# Patient Record
Sex: Female | Born: 1955 | Race: White | Hispanic: No | Marital: Married | Smoking: Never smoker
Health system: Southern US, Community
[De-identification: ages and names within clinical notes are randomized; demographics above are authoritative.]

## PROBLEM LIST (undated history)

## (undated) ENCOUNTER — Ambulatory Visit (HOSPITAL_BASED_OUTPATIENT_CLINIC_OR_DEPARTMENT_OTHER)

## (undated) DIAGNOSIS — F419 Anxiety disorder, unspecified: Secondary | ICD-10-CM

## (undated) DIAGNOSIS — K602 Anal fissure, unspecified: Secondary | ICD-10-CM

## (undated) DIAGNOSIS — L219 Seborrheic dermatitis, unspecified: Secondary | ICD-10-CM

## (undated) DIAGNOSIS — E669 Obesity, unspecified: Secondary | ICD-10-CM

## (undated) DIAGNOSIS — F32A Depression, unspecified: Secondary | ICD-10-CM

## (undated) DIAGNOSIS — M549 Dorsalgia, unspecified: Secondary | ICD-10-CM

## (undated) DIAGNOSIS — K589 Irritable bowel syndrome without diarrhea: Secondary | ICD-10-CM

## (undated) DIAGNOSIS — K5900 Constipation, unspecified: Secondary | ICD-10-CM

## (undated) DIAGNOSIS — K5790 Diverticulosis of intestine, part unspecified, without perforation or abscess without bleeding: Secondary | ICD-10-CM

## (undated) DIAGNOSIS — E785 Hyperlipidemia, unspecified: Secondary | ICD-10-CM

## (undated) DIAGNOSIS — C9110 Chronic lymphocytic leukemia of B-cell type not having achieved remission: Secondary | ICD-10-CM

## (undated) DIAGNOSIS — N200 Calculus of kidney: Secondary | ICD-10-CM

## (undated) HISTORY — DX: Diverticulosis of intestine, part unspecified, without perforation or abscess without bleeding: K57.90

## (undated) HISTORY — DX: Chronic lymphocytic leukemia of B-cell type not having achieved remission: C91.10

## (undated) HISTORY — DX: Anxiety disorder, unspecified: F41.9

## (undated) HISTORY — DX: Constipation, unspecified: K59.00

## (undated) HISTORY — DX: Depression, unspecified: F32.A

## (undated) HISTORY — DX: Hyperlipidemia, unspecified: E78.5

## (undated) HISTORY — DX: Dorsalgia, unspecified: M54.9

## (undated) HISTORY — PX: OTHER SURGICAL HISTORY: SHX169

## (undated) HISTORY — DX: Calculus of kidney: N20.0

## (undated) HISTORY — DX: Obesity, unspecified: E66.9

## (undated) HISTORY — PX: LITHOTRIPSY: SUR834

## (undated) HISTORY — DX: Anal fissure, unspecified: K60.2

## (undated) HISTORY — PX: ANAL FISSURE REPAIR: SHX2312

## (undated) HISTORY — DX: Irritable bowel syndrome, unspecified: K58.9

---

## 1998-05-19 ENCOUNTER — Ambulatory Visit (HOSPITAL_COMMUNITY): Admission: RE | Admit: 1998-05-19 | Discharge: 1998-05-19 | Payer: Self-pay | Admitting: General Surgery

## 1998-10-13 DIAGNOSIS — K649 Unspecified hemorrhoids: Secondary | ICD-10-CM | POA: Insufficient documentation

## 2003-11-29 ENCOUNTER — Ambulatory Visit: Payer: Self-pay | Admitting: Internal Medicine

## 2003-11-30 ENCOUNTER — Ambulatory Visit: Payer: Self-pay | Admitting: Internal Medicine

## 2003-12-01 ENCOUNTER — Encounter: Payer: Self-pay | Admitting: Internal Medicine

## 2004-01-06 ENCOUNTER — Ambulatory Visit: Payer: Self-pay | Admitting: Internal Medicine

## 2006-11-25 ENCOUNTER — Ambulatory Visit: Payer: Self-pay | Admitting: Internal Medicine

## 2007-02-20 DIAGNOSIS — K589 Irritable bowel syndrome without diarrhea: Secondary | ICD-10-CM

## 2007-02-20 HISTORY — DX: Irritable bowel syndrome, unspecified: K58.9

## 2007-03-09 ENCOUNTER — Ambulatory Visit: Payer: Self-pay | Admitting: Oncology

## 2007-03-27 ENCOUNTER — Ambulatory Visit: Payer: Self-pay | Admitting: Hematology & Oncology

## 2007-03-31 ENCOUNTER — Ambulatory Visit: Payer: Self-pay | Admitting: Oncology

## 2007-04-09 ENCOUNTER — Ambulatory Visit: Payer: Self-pay | Admitting: Oncology

## 2007-05-09 ENCOUNTER — Ambulatory Visit: Payer: Self-pay | Admitting: Oncology

## 2007-06-09 DIAGNOSIS — K602 Anal fissure, unspecified: Secondary | ICD-10-CM | POA: Insufficient documentation

## 2007-06-09 DIAGNOSIS — Z87442 Personal history of urinary calculi: Secondary | ICD-10-CM | POA: Insufficient documentation

## 2007-06-09 HISTORY — DX: Personal history of urinary calculi: Z87.442

## 2007-06-10 ENCOUNTER — Ambulatory Visit: Payer: Self-pay | Admitting: Internal Medicine

## 2007-07-17 ENCOUNTER — Ambulatory Visit: Payer: Self-pay | Admitting: Hematology & Oncology

## 2007-07-17 ENCOUNTER — Other Ambulatory Visit: Admission: RE | Admit: 2007-07-17 | Discharge: 2007-07-17 | Payer: Self-pay | Admitting: Hematology & Oncology

## 2007-07-17 ENCOUNTER — Encounter: Payer: Self-pay | Admitting: Hematology & Oncology

## 2007-07-21 LAB — COMPREHENSIVE METABOLIC PANEL
ALT: 27 U/L (ref 0–35)
Albumin: 5.1 g/dL (ref 3.5–5.2)
CO2: 25 mEq/L (ref 19–32)
Glucose, Bld: 85 mg/dL (ref 70–99)
Potassium: 4.1 mEq/L (ref 3.5–5.3)
Sodium: 138 mEq/L (ref 135–145)
Total Bilirubin: 0.4 mg/dL (ref 0.3–1.2)
Total Protein: 7.7 g/dL (ref 6.0–8.3)

## 2007-07-21 LAB — PROTEIN ELECTROPHORESIS, SERUM
Alpha-2-Globulin: 9.8 % (ref 7.1–11.8)
Beta Globulin: 6.4 % (ref 4.7–7.2)
Gamma Globulin: 10.5 % — ABNORMAL LOW (ref 11.1–18.8)

## 2007-07-21 LAB — FERRITIN: Ferritin: 52 ng/mL (ref 10–291)

## 2007-07-21 LAB — RETICULOCYTES (CHCC): ABS Retic: 57.7 10*3/uL (ref 19.0–186.0)

## 2007-09-23 ENCOUNTER — Ambulatory Visit: Payer: Self-pay | Admitting: Hematology & Oncology

## 2007-09-24 LAB — CBC WITH DIFFERENTIAL (CANCER CENTER ONLY)
HCT: 36.4 % (ref 34.8–46.6)
MCH: 29.2 pg (ref 26.0–34.0)
MCV: 85 fL (ref 81–101)
Platelets: ADEQUATE 10*3/uL (ref 145–400)
RBC: 4.28 10*6/uL (ref 3.70–5.32)
RDW: 12.3 % (ref 10.5–14.6)

## 2007-09-24 LAB — MANUAL DIFFERENTIAL (CHCC SATELLITE)
Eos: 2 % (ref 0–7)
LYMPH: 43 % (ref 14–48)
MONO: 5 % (ref 0–13)
SEG: 50 % (ref 40–75)

## 2007-09-24 LAB — CHCC SATELLITE - SMEAR

## 2008-01-15 ENCOUNTER — Ambulatory Visit: Payer: Self-pay | Admitting: Hematology & Oncology

## 2008-01-16 LAB — CBC WITH DIFFERENTIAL (CANCER CENTER ONLY)
BASO#: 0.2 10*3/uL (ref 0.0–0.2)
Eosinophils Absolute: 0.2 10*3/uL (ref 0.0–0.5)
HGB: 12.7 g/dL (ref 11.6–15.9)
LYMPH%: 65.7 % — ABNORMAL HIGH (ref 14.0–48.0)
MCH: 29.6 pg (ref 26.0–34.0)
MCV: 87 fL (ref 81–101)
MONO#: 0.7 10*3/uL (ref 0.1–0.9)
MONO%: 4.8 % (ref 0.0–13.0)
RBC: 4.29 10*6/uL (ref 3.70–5.32)

## 2008-03-23 ENCOUNTER — Telehealth: Payer: Self-pay | Admitting: Internal Medicine

## 2008-03-23 ENCOUNTER — Ambulatory Visit: Payer: Self-pay | Admitting: Gastroenterology

## 2008-03-23 DIAGNOSIS — R1033 Periumbilical pain: Secondary | ICD-10-CM | POA: Insufficient documentation

## 2008-03-25 LAB — CONVERTED CEMR LAB
ALT: 34 units/L (ref 0–35)
Basophils Absolute: 0 10*3/uL (ref 0.0–0.1)
CO2: 32 meq/L (ref 19–32)
Calcium: 9.7 mg/dL (ref 8.4–10.5)
Chloride: 103 meq/L (ref 96–112)
GFR calc non Af Amer: 80.03 mL/min (ref >60)
Lymphocytes Relative: 65 % — ABNORMAL HIGH (ref 12.0–46.0)
Monocytes Relative: 3 % (ref 3.0–12.0)
Platelets: 217 10*3/uL (ref 150.0–400.0)
Potassium: 4.4 meq/L (ref 3.5–5.1)
RDW: 12.2 % (ref 11.5–14.6)
Sodium: 143 meq/L (ref 135–145)
Total Protein: 7.5 g/dL (ref 6.0–8.3)

## 2008-05-13 ENCOUNTER — Ambulatory Visit: Payer: Self-pay | Admitting: Hematology & Oncology

## 2008-05-14 LAB — CBC WITH DIFFERENTIAL (CANCER CENTER ONLY)
BASO#: 0.1 10*3/uL (ref 0.0–0.2)
Eosinophils Absolute: 0.2 10*3/uL (ref 0.0–0.5)
HGB: 12.2 g/dL (ref 11.6–15.9)
LYMPH#: 8.5 10*3/uL — ABNORMAL HIGH (ref 0.9–3.3)
MONO#: 0.5 10*3/uL (ref 0.1–0.9)
NEUT#: 3.7 10*3/uL (ref 1.5–6.5)
RBC: 4.17 10*6/uL (ref 3.70–5.32)
WBC: 13 10*3/uL — ABNORMAL HIGH (ref 3.9–10.0)

## 2008-11-10 ENCOUNTER — Ambulatory Visit: Payer: Self-pay | Admitting: Hematology & Oncology

## 2008-11-11 LAB — CBC WITH DIFFERENTIAL (CANCER CENTER ONLY)
BASO#: 0.2 10*3/uL (ref 0.0–0.2)
Eosinophils Absolute: 0.1 10*3/uL (ref 0.0–0.5)
HGB: 13.1 g/dL (ref 11.6–15.9)
LYMPH%: 68.1 % — ABNORMAL HIGH (ref 14.0–48.0)
MCH: 29.9 pg (ref 26.0–34.0)
MCV: 84 fL (ref 81–101)
MONO%: 4 % (ref 0.0–13.0)
RBC: 4.39 10*6/uL (ref 3.70–5.32)

## 2008-11-19 ENCOUNTER — Telehealth: Payer: Self-pay | Admitting: Internal Medicine

## 2008-11-22 ENCOUNTER — Ambulatory Visit: Payer: Self-pay | Admitting: Internal Medicine

## 2008-11-22 DIAGNOSIS — C9110 Chronic lymphocytic leukemia of B-cell type not having achieved remission: Secondary | ICD-10-CM

## 2008-11-22 HISTORY — DX: Chronic lymphocytic leukemia of B-cell type not having achieved remission: C91.10

## 2009-01-12 ENCOUNTER — Ambulatory Visit: Payer: Self-pay | Admitting: Hematology & Oncology

## 2009-01-13 LAB — CBC WITH DIFFERENTIAL (CANCER CENTER ONLY)
Eosinophils Absolute: 0.2 10*3/uL (ref 0.0–0.5)
LYMPH%: 71.3 % — ABNORMAL HIGH (ref 14.0–48.0)
MCH: 29.3 pg (ref 26.0–34.0)
MCV: 87 fL (ref 81–101)
MONO%: 4.1 % (ref 0.0–13.0)
Platelets: 258 10*3/uL (ref 145–400)
RBC: 4.34 10*6/uL (ref 3.70–5.32)
RDW: 11.4 % (ref 10.5–14.6)

## 2009-01-13 LAB — CHCC SATELLITE - SMEAR

## 2009-05-11 ENCOUNTER — Ambulatory Visit: Payer: Self-pay | Admitting: Hematology & Oncology

## 2009-05-12 LAB — CBC WITH DIFFERENTIAL (CANCER CENTER ONLY)
BASO#: 0.3 10*3/uL — ABNORMAL HIGH (ref 0.0–0.2)
EOS%: 1 % (ref 0.0–7.0)
Eosinophils Absolute: 0.2 10*3/uL (ref 0.0–0.5)
HCT: 35.9 % (ref 34.8–46.6)
HGB: 12.2 g/dL (ref 11.6–15.9)
LYMPH#: 12.5 10*3/uL — ABNORMAL HIGH (ref 0.9–3.3)
MCH: 29.2 pg (ref 26.0–34.0)
MCHC: 33.8 g/dL (ref 32.0–36.0)
MONO%: 3.6 % (ref 0.0–13.0)
NEUT%: 21.9 % — ABNORMAL LOW (ref 39.6–80.0)
RBC: 4.16 10*6/uL (ref 3.70–5.32)

## 2009-09-15 ENCOUNTER — Ambulatory Visit: Payer: Self-pay | Admitting: Hematology & Oncology

## 2009-09-16 LAB — CBC WITH DIFFERENTIAL (CANCER CENTER ONLY)
BASO%: 1.3 % (ref 0.0–2.0)
LYMPH%: 74.4 % — ABNORMAL HIGH (ref 14.0–48.0)
MCH: 29.2 pg (ref 26.0–34.0)
MCHC: 34 g/dL (ref 32.0–36.0)
MCV: 86 fL (ref 81–101)
MONO%: 3.8 % (ref 0.0–13.0)
NEUT%: 19.4 % — ABNORMAL LOW (ref 39.6–80.0)
Platelets: 266 10*3/uL (ref 145–400)
RDW: 12.6 % (ref 10.5–14.6)

## 2009-09-16 LAB — MORPHOLOGY - CHCC SATELLITE

## 2010-02-16 ENCOUNTER — Encounter (HOSPITAL_BASED_OUTPATIENT_CLINIC_OR_DEPARTMENT_OTHER): Payer: BC Managed Care – PPO | Admitting: Hematology & Oncology

## 2010-02-16 ENCOUNTER — Other Ambulatory Visit: Payer: Self-pay | Admitting: Hematology & Oncology

## 2010-02-16 DIAGNOSIS — C9110 Chronic lymphocytic leukemia of B-cell type not having achieved remission: Secondary | ICD-10-CM

## 2010-02-16 LAB — CBC WITH DIFFERENTIAL (CANCER CENTER ONLY)
BASO%: 1.8 % (ref 0.0–2.0)
EOS%: 1 % (ref 0.0–7.0)
HCT: 39.1 % (ref 34.8–46.6)
LYMPH#: 15.7 10*3/uL — ABNORMAL HIGH (ref 0.9–3.3)
LYMPH%: 70 % — ABNORMAL HIGH (ref 14.0–48.0)
MCH: 28.6 pg (ref 26.0–34.0)
MCHC: 33.3 g/dL (ref 32.0–36.0)
MONO%: 4.7 % (ref 0.0–13.0)
NEUT%: 22.5 % — ABNORMAL LOW (ref 39.6–80.0)
RDW: 12 % (ref 10.5–14.6)

## 2010-05-22 ENCOUNTER — Telehealth: Payer: Self-pay | Admitting: Internal Medicine

## 2010-05-22 NOTE — Telephone Encounter (Signed)
Patient is scheduled for REV for 05/26/10 3:15

## 2010-05-23 NOTE — Assessment & Plan Note (Signed)
[facility]                         GASTROENTEROLOGY OFFICE NOTE   Alicia Snow, Alicia Snow                         MRN:          [id number]  DATE:11/25/2006                            DOB:          [birth year]    Alicia Snow is a very nice 55 year old white female with history of  irritable bowel syndrome, suspected inflammatory bowel disease by  another gastroenterologist but over a period of years, we have not been  able to document any evidence of inflammatory bowel disease.  She had a  repair of anal fissure in 2000 on  last colonoscopy in December 2005.  She has a positive family history of colon cancer in maternal  grandmother and maternal grandfather.  She comes here today because of  new onset of rectal fullness, difficulty in evacuation, and some  irritation.   PHYSICAL EXAM:  Blood pressure 112/66, pulse 60, and weight 147 pounds.  This represents 10 pound weight loss in last 2 years.  ABDOMEN:  Unremarkable.  ANOSCOPIC AND RECTAL EXAM:  There was 1 external hemorrhoidal tag which  was inactive, no perianal disease.  Rectal tone was normal.  First grade  hemorrhoids noticed in the rectal ampulla.  At least 4 of them were  counted, were bluish in discoloration and slightly protruding.  There  was no actual prolapse.  Stool was hemoccult negative.  There was no  impaction.  There was no rectal prolapse itself.   IMPRESSION:  A 55 year old white female with rectal symptoms, possibly  related to symptomatic first grade hemorrhoids.  I cannot entirely rule  out the possibility of pelvic relaxation causing small rectocele.  She  does not seem to have associated urinary incontinence, stress  incontinence, or any sign of uterine descent.   PLAN:  1. I would first try treating the hemorrhoids with Anusol HC      suppositories, and I gave her a booklet on hemorrhoids as well.  2. Begin Benefiber on a daily basis to improve her  bowel habits.  3. If the  symptoms continue even beyond the hemorrhoidal resolution, I      would ask a gynecologist to examine her for possibility of pelvic      relaxation to assess if she truly has a rectocele that could be      repaired.     Hedwig Morton. Juanda Chance, MD  Electronically Signed    DMB/MedQ  DD: 11/25/2006  DT: 11/25/2006  Job #: [id number]   cc:   Jacqualine Mau

## 2010-06-12 ENCOUNTER — Other Ambulatory Visit: Payer: Self-pay | Admitting: Hematology & Oncology

## 2010-06-12 ENCOUNTER — Encounter (HOSPITAL_BASED_OUTPATIENT_CLINIC_OR_DEPARTMENT_OTHER): Payer: BC Managed Care – PPO | Admitting: Hematology & Oncology

## 2010-06-12 DIAGNOSIS — C9110 Chronic lymphocytic leukemia of B-cell type not having achieved remission: Secondary | ICD-10-CM

## 2010-06-12 LAB — CBC WITH DIFFERENTIAL (CANCER CENTER ONLY)
Eosinophils Absolute: 0.2 10*3/uL (ref 0.0–0.5)
HGB: 12.5 g/dL (ref 11.6–15.9)
LYMPH%: 73.7 % — ABNORMAL HIGH (ref 14.0–48.0)
MCV: 86 fL (ref 81–101)
MONO#: 0.8 10*3/uL (ref 0.1–0.9)
Platelets: 296 10*3/uL (ref 145–400)
RBC: 4.4 10*6/uL (ref 3.70–5.32)
WBC: 20.2 10*3/uL — ABNORMAL HIGH (ref 3.9–10.0)

## 2010-06-12 LAB — TECHNOLOGIST REVIEW CHCC SATELLITE

## 2010-06-26 ENCOUNTER — Ambulatory Visit (INDEPENDENT_AMBULATORY_CARE_PROVIDER_SITE_OTHER): Payer: BC Managed Care – PPO | Admitting: Internal Medicine

## 2010-06-26 ENCOUNTER — Encounter: Payer: Self-pay | Admitting: Internal Medicine

## 2010-06-26 VITALS — BP 100/70 | HR 76 | Ht 62.0 in | Wt 187.0 lb

## 2010-06-26 DIAGNOSIS — K625 Hemorrhage of anus and rectum: Secondary | ICD-10-CM

## 2010-06-26 DIAGNOSIS — K602 Anal fissure, unspecified: Secondary | ICD-10-CM

## 2010-06-26 DIAGNOSIS — K589 Irritable bowel syndrome without diarrhea: Secondary | ICD-10-CM

## 2010-06-26 MED ORDER — DICYCLOMINE HCL 10 MG PO CAPS: 10.0000 mg | ORAL_CAPSULE | Freq: Two times a day (BID) | ORAL | Status: DC

## 2010-06-26 MED ORDER — CIPROFLOXACIN HCL 250 MG PO TABS: 250.0000 mg | ORAL_TABLET | Freq: Every day | ORAL | Status: AC

## 2010-06-26 MED ORDER — HYDROCORTISONE ACETATE 25 MG RE SUPP: RECTAL | Status: DC

## 2010-06-26 MED ORDER — PEG-KCL-NACL-NASULF-NA ASC-C 100 G PO SOLR: 1.0000 | Freq: Once | ORAL | Status: DC

## 2010-06-26 NOTE — Progress Notes (Signed)
Alicia Snow 11/08/1955 MRN [id number]      History of Present Illness:  This is a 55 year old white female with a recent diagnosis of CLL. We have been following her chronic diarrhea due to irritable bowel syndrome. She was initially diagnosed as having Crohn's disease in the 1990s in [place], but subsequent colonoscopies and imaging studies did not show any evidence of inflammatory bowel disease and she has responded to an IBS regimen. Her diarrhea is urgent and sometimes incontinent. Her weight has continued to increase. Her last colonoscopy in December 2005 was normal. She has a family history of breast cancer in her mother and grandmother She has been followed by Dr Myna Hidalgo and her most recent white blood cell count was 20,000.There is a history of anal fissure which was surgically repaired in the 1990s.   Past Medical History  Diagnosis Date  . Anal fissure   . IBS (irritable bowel syndrome)   . Hemorrhoids   . CLL (chronic lymphoblastic leukemia)   . Nephrolithiasis    Past Surgical History  Procedure Date  . Anal fissure repair   . Excision of polyp and external anal tag   . Lithotripsy     reports that she has never smoked. She has never used smokeless tobacco. She reports that she does not drink alcohol or use illicit drugs. family history includes Breast cancer in her mother and unspecified family member; Diabetes in an unspecified family member; Heart disease in her father and unspecified family members; and Liver cancer in an unspecified family member.  There is no history of Colon cancer. Allergies  Allergen Reactions  . Nitrofurantoin         Review of Systems: Positive for occasional gastroesophageal reflux. Denies dysphagia chest pain cough or shortness of breath positive for weight gain  The remainder of the 10  point ROS is negative except as outlined in H&P   Physical Exam: General appearance  Well developed in no distress overweight Eyes- non  icteric HEENT nontraumatic, normocephalic. Mouth no lesions, tongue papillated, no cheilosis. Neck supple without adenopathy, thyroid not enlarged, no carotid bruits, no JVD. Lungs Clear to auscultation bilaterally. Cor normal S1 normal S2, regular rhythm , no murmur,  quiet precordium. Abdomen obese protuberant abdomen with normoactive bowel sounds. Tenderness in right lower quadrant but no palpable mass. No ascites or fluid wave. Rectal: and anoscopic exam reveals several external hemorrhoidal tags, normal rectal sphincter tone. Several first grade internal hemorrhoids with edema and hyperemia. Somewhat tender. No active bleeding. Stool is Hemoccult negative. Extremities no pedal edema. Skin no lesions. Neurological alert and oriented x 3. Psychological normal mood and affect.  Assessment and Plan:  Problem #1 irritable bowel syndrome with predominant diarrhea. Patient has had a recent exacerbation of diarrhea. We need to rule out microscopic or collagenous colitis which may be associated with CLL. We will proceed with a colonoscopy and random biopsies. She took Cipro for a urinary tract infection with complete resolution of diarrhea. We will put her back on Cipro 250 mg once a day until we can do the colonoscopy. We will add Bentyl 10 mg twice a day.  Problem #2 symptomatic first grade hemorrhoids. We will start her on Anusol HC suppositories every night for symptomatic relief of hemorrhoids.    06/26/2010 Lina Sar

## 2010-06-26 NOTE — Patient Instructions (Signed)
You have been scheduled for a colonoscopy. Please follow written instructions given to you at your visit today.  Please pick up your Moviprep kit at the pharmacy within the next 2-3 days. We have sent a prescription to the pharmacy for Cipro 250 mg twice daily. We have sent a prescription to the pharmacy for hydrocortisone suppositories. You should insert 1 per rectum every night. We have sent a prescription for Bentyl to your pharmacy. You should take 1 tablet by mouth twice daily.

## 2010-06-27 ENCOUNTER — Encounter: Payer: Self-pay | Admitting: Internal Medicine

## 2010-07-11 ENCOUNTER — Encounter: Payer: Self-pay | Admitting: Internal Medicine

## 2010-07-11 ENCOUNTER — Ambulatory Visit (AMBULATORY_SURGERY_CENTER): Payer: BC Managed Care – PPO | Admitting: Internal Medicine

## 2010-07-11 VITALS — BP 116/72 | HR 64 | Temp 98.8°F | Resp 14 | Ht 62.0 in | Wt 185.0 lb

## 2010-07-11 DIAGNOSIS — R1033 Periumbilical pain: Secondary | ICD-10-CM

## 2010-07-11 DIAGNOSIS — R197 Diarrhea, unspecified: Secondary | ICD-10-CM

## 2010-07-11 DIAGNOSIS — K602 Anal fissure, unspecified: Secondary | ICD-10-CM

## 2010-07-11 DIAGNOSIS — K589 Irritable bowel syndrome without diarrhea: Secondary | ICD-10-CM

## 2010-07-11 DIAGNOSIS — K5730 Diverticulosis of large intestine without perforation or abscess without bleeding: Secondary | ICD-10-CM

## 2010-07-11 DIAGNOSIS — K625 Hemorrhage of anus and rectum: Secondary | ICD-10-CM

## 2010-07-11 MED ORDER — SODIUM CHLORIDE 0.9 % IV SOLN: 500.0000 mL | INTRAVENOUS | Status: DC

## 2010-07-11 NOTE — Patient Instructions (Signed)
Discharge instructions given with verbal understanding. Handout on diverticulosis and a high fiber diet given. Resume previous medications. 

## 2010-07-13 ENCOUNTER — Telehealth: Payer: Self-pay | Admitting: *Deleted

## 2010-07-13 NOTE — Telephone Encounter (Signed)

## 2010-07-20 ENCOUNTER — Encounter: Payer: Self-pay | Admitting: Internal Medicine

## 2010-09-13 ENCOUNTER — Other Ambulatory Visit: Payer: Self-pay | Admitting: Hematology & Oncology

## 2010-09-13 ENCOUNTER — Encounter: Payer: BC Managed Care – PPO | Admitting: Hematology & Oncology

## 2010-09-13 ENCOUNTER — Encounter (HOSPITAL_BASED_OUTPATIENT_CLINIC_OR_DEPARTMENT_OTHER): Payer: BC Managed Care – PPO | Admitting: Hematology & Oncology

## 2010-09-13 DIAGNOSIS — C9110 Chronic lymphocytic leukemia of B-cell type not having achieved remission: Secondary | ICD-10-CM

## 2010-09-13 LAB — CBC WITH DIFFERENTIAL (CANCER CENTER ONLY)
BASO#: 0.1 10*3/uL (ref 0.0–0.2)
BASO%: 0.2 % (ref 0.0–2.0)
EOS%: 0.8 % (ref 0.0–7.0)
HCT: 37.6 % (ref 34.8–46.6)
LYMPH#: 16 10*3/uL — ABNORMAL HIGH (ref 0.9–3.3)
MCH: 29.6 pg (ref 26.0–34.0)
MCHC: 34 g/dL (ref 32.0–36.0)
MONO%: 3.9 % (ref 0.0–13.0)
NEUT%: 20.7 % — ABNORMAL LOW (ref 39.6–80.0)
RDW: 13.4 % (ref 11.1–15.7)

## 2010-12-21 ENCOUNTER — Other Ambulatory Visit: Payer: Self-pay | Admitting: Hematology & Oncology

## 2010-12-21 ENCOUNTER — Other Ambulatory Visit (HOSPITAL_BASED_OUTPATIENT_CLINIC_OR_DEPARTMENT_OTHER): Payer: BC Managed Care – PPO | Admitting: Lab

## 2010-12-21 ENCOUNTER — Ambulatory Visit (HOSPITAL_BASED_OUTPATIENT_CLINIC_OR_DEPARTMENT_OTHER): Payer: BC Managed Care – PPO | Admitting: Hematology & Oncology

## 2010-12-21 VITALS — HR 71 | Temp 96.9°F | Ht 62.0 in | Wt 191.0 lb

## 2010-12-21 DIAGNOSIS — C9110 Chronic lymphocytic leukemia of B-cell type not having achieved remission: Secondary | ICD-10-CM

## 2010-12-21 LAB — CBC WITH DIFFERENTIAL (CANCER CENTER ONLY)
BASO#: 0.1 10*3/uL (ref 0.0–0.2)
Eosinophils Absolute: 0.2 10*3/uL (ref 0.0–0.5)
HGB: 12.3 g/dL (ref 11.6–15.9)
MCH: 28.8 pg (ref 26.0–34.0)
MONO#: 0.9 10*3/uL (ref 0.1–0.9)
MONO%: 4.1 % (ref 0.0–13.0)
NEUT#: 3.7 10*3/uL (ref 1.5–6.5)
Platelets: 239 10*3/uL (ref 145–400)
RBC: 4.27 10*6/uL (ref 3.70–5.32)
WBC: 23 10*3/uL — ABNORMAL HIGH (ref 3.9–10.0)

## 2010-12-21 LAB — CHCC SATELLITE - SMEAR

## 2010-12-21 LAB — TECHNOLOGIST REVIEW CHCC SATELLITE

## 2010-12-21 NOTE — Progress Notes (Signed)
CC:   Alicia Mau, MD  DIAGNOSIS:  Stage A chronic lymphocytic leukemia.  CURRENT THERAPY:  Observation.  INTERIM HISTORY:  Alicia Snow comes in for a 1-month followup.  She is doing okay.  She still is having some weight issues.  She says she is exercising.  She has not been as attentive to this over the holidays, however.  She has had no cough or fever.  She has not noticed any palpable lymph glands.  There has been no fatigue.  She is working full time.  She said that she just got a nice Christmas bonus.  She has not had any problems with rashes.  There has been no pruritus. There has been no change in bowel or bladder habits.  PHYSICAL EXAM:  General:  This is a mildly obese white female in no obvious distress.  Vital Signs:  Temperature 96.9, pulse 71, respiratory rate 18, blood pressure 141/80, weight 191.  Head/Neck:  Exam shows a normocephalic, atraumatic skull.  There are no ocular or oral lesions. There are no palpable cervical or supraclavicular lymph nodes.  Lungs: Clear bilaterally.  Cardiac:  Regular rate and rhythm with a normal S1, S2.  There are no murmurs, rubs or bruits.  Abdomen:  Soft with good bowel sounds.  There is no palpable abdominal mass.  There is no fluid wave.  There is no palpable hepatosplenomegaly.  Lymphatics:  Axillary exam shows no bilateral axillary adenopathy.  Inguinal exam shows no inguinal adenopathy bilaterally.  Extremities:  No clubbing, cyanosis or edema.  Neurologic:  Exam shows no focal neurological deficits.  Skin: No rashes, ecchymosis or petechiae.  LABORATORY STUDIES:  White cell count 23, hemoglobin 12.3, hematocrit 37.2, platelet count 239.  White cell differential shows 16 segs, 79 lymphocytes.  IMPRESSION:  Alicia Snow is a 55 year old white female with stage A chronic lymphocytic leukemia.  We have been seeing her now for about 4 years. Her white cell count is trending up slowly but surely.  Her lymphocyte percentage is also  going up gradually. I still do not see an indication for therapy on her.  I talked to her at length about this.  This does worry her quite a bit.  I want to see her back in about 3 months.  I suspect that if the white cell count continues to trend upward, then we are going to have be a little more "aggressive" and consider a bone marrow test, etc.  TIME SPENT:  I spent over a half hour with her today.  I tried to reassure her as much as I could.    ______________________________ Josph Macho, M.D. PRE/MEDQ  D:  12/21/2010  T:  12/21/2010  Job:  [id number]

## 2010-12-21 NOTE — Progress Notes (Signed)
This office note has been dictated.

## 2011-03-16 ENCOUNTER — Telehealth: Payer: Self-pay | Admitting: Hematology & Oncology

## 2011-03-16 NOTE — Telephone Encounter (Signed)
Left pt message moved time of 3-14

## 2011-03-22 ENCOUNTER — Ambulatory Visit (HOSPITAL_BASED_OUTPATIENT_CLINIC_OR_DEPARTMENT_OTHER): Payer: BC Managed Care – PPO | Admitting: Hematology & Oncology

## 2011-03-22 ENCOUNTER — Other Ambulatory Visit (HOSPITAL_BASED_OUTPATIENT_CLINIC_OR_DEPARTMENT_OTHER): Payer: BC Managed Care – PPO | Admitting: Lab

## 2011-03-22 ENCOUNTER — Ambulatory Visit: Payer: BC Managed Care – PPO | Admitting: Hematology & Oncology

## 2011-03-22 ENCOUNTER — Other Ambulatory Visit: Payer: BC Managed Care – PPO | Admitting: Lab

## 2011-03-22 VITALS — BP 122/77 | HR 65 | Temp 96.9°F | Ht 62.0 in | Wt 192.0 lb

## 2011-03-22 DIAGNOSIS — C9110 Chronic lymphocytic leukemia of B-cell type not having achieved remission: Secondary | ICD-10-CM

## 2011-03-22 LAB — CBC WITH DIFFERENTIAL (CANCER CENTER ONLY)
BASO#: 0.1 10*3/uL (ref 0.0–0.2)
Eosinophils Absolute: 0.2 10*3/uL (ref 0.0–0.5)
HCT: 38.3 % (ref 34.8–46.6)
HGB: 12.6 g/dL (ref 11.6–15.9)
LYMPH#: 16.4 10*3/uL — ABNORMAL HIGH (ref 0.9–3.3)
MCHC: 32.9 g/dL (ref 32.0–36.0)
MONO#: 0.9 10*3/uL (ref 0.1–0.9)
NEUT#: 4.1 10*3/uL (ref 1.5–6.5)
NEUT%: 18.7 % — ABNORMAL LOW (ref 39.6–80.0)
RBC: 4.41 10*6/uL (ref 3.70–5.32)
WBC: 21.6 10*3/uL — ABNORMAL HIGH (ref 3.9–10.0)

## 2011-03-22 NOTE — Progress Notes (Signed)
Diagnosis: CLL-stage A.  Current therapy: Observation.  Interim history: Alicia Snow comes in for followup. We last saw her back in December. She is exercising. She is trying to lose weight. She is watching what she eats.  Had no nausea or vomiting. Has been no fever. She's had no rashes. Has been no change in bowel or bladder habits. She's had no headache. Has been no dysphagia or odynophagia.  On her physical exam this is a well-developed well-nourished white female in no obvious distress. Vital signs show temperature of 96 9. Pulse 65. His heart rate 16 blood pressure 122/77.Wt is 192.  Head and neck exam shows a normocephalic atraumatic scope. There is no ocular lesions. There are no palpable cervical or supra-clavicular lymph nodes. Lungs are clear bilaterally. Cardiac exam regular rate and rhythm with no murmurs rubs or bruits. Abdominal exam soft with good bowel sounds. There is no palpable abdominal masses. There is no fluid wave. There is no palpable hepatospleno megaly. Axillary exam shows no bilateral axillary adenopathy. Extremities shows no clubbing cyanosis or edema. Neurological exam no focal neurological deficits. Skin exam no rashes ecchymoses or petechia.  Laboratory studies Roxicodone 1.6 hemoglobin 13.8 hematocrit 30 platelet count 183. The site percentage is 75%.  Impression: Alicia Snow is a 56 year old female with sustained a CLL. We have been seeing her now for 4 years. Her right cell count has trended up very, very slowly.  There is no indication for intervention. There is no need for treatment.  Back to see [place] in another 3-4 months.  Thanks

## 2011-06-14 ENCOUNTER — Ambulatory Visit (HOSPITAL_BASED_OUTPATIENT_CLINIC_OR_DEPARTMENT_OTHER): Payer: BC Managed Care – PPO | Admitting: Hematology & Oncology

## 2011-06-14 ENCOUNTER — Other Ambulatory Visit (HOSPITAL_BASED_OUTPATIENT_CLINIC_OR_DEPARTMENT_OTHER): Payer: BC Managed Care – PPO | Admitting: Lab

## 2011-06-14 VITALS — BP 135/80 | HR 95 | Temp 97.9°F | Ht 62.0 in | Wt 194.0 lb

## 2011-06-14 DIAGNOSIS — C9110 Chronic lymphocytic leukemia of B-cell type not having achieved remission: Secondary | ICD-10-CM

## 2011-06-14 LAB — CBC WITH DIFFERENTIAL (CANCER CENTER ONLY)
BASO#: 0.1 10*3/uL (ref 0.0–0.2)
Eosinophils Absolute: 0.2 10*3/uL (ref 0.0–0.5)
HCT: 38.3 % (ref 34.8–46.6)
HGB: 12.8 g/dL (ref 11.6–15.9)
LYMPH%: 75.4 % — ABNORMAL HIGH (ref 14.0–48.0)
MCH: 29.6 pg (ref 26.0–34.0)
MCV: 89 fL (ref 81–101)
MONO#: 0.9 10*3/uL (ref 0.1–0.9)
MONO%: 3.8 % (ref 0.0–13.0)
Platelets: 251 10*3/uL (ref 145–400)
RBC: 4.33 10*6/uL (ref 3.70–5.32)
WBC: 22.2 10*3/uL — ABNORMAL HIGH (ref 3.9–10.0)

## 2011-06-14 NOTE — Progress Notes (Signed)
This office note has been dictated.

## 2011-06-15 NOTE — Progress Notes (Signed)
CC:   Alicia Mau, MD  DIAGNOSIS:  Stage A chronic lymphocytic leukemia.  CURRENT THERAPY:  Observation.  RECENT HISTORY:  Alicia Snow comes in for followup.  She is doing okay. She is still having some weight problems.  She is exercising.  She is walking.  She is doing stretching.  She is doing aerobics.  However, it is still hard for her to lose weight.  I do not know if this might be from medications that she is on.  She is on Zoloft, which I guess might be a problem.  She is under some stress at home with her husband having OCD.  She has not had any problems with infections.  There is no bleeding. There is no change in bowel or bladder habits.  She has diarrhea.  She has irritable bowel syndrome.  She is very, very, very generous today and brought me in a present for my birthday which I am very humbled by.  PHYSICAL EXAM:  General:  This is a well-developed, well-nourished white female in no obvious distress.  Vital Signs:  Show a temperature of 97.8, pulse 95, respiratory rate 18, blood pressure 135/80, weight is 194.  Head and neck:  Shows a normocephalic, atraumatic skull.  There are no ocular or oral lesions.  There are no palpable cervical or supraclavicular lymph nodes.  Lungs:  Clear bilaterally.  Cardiac examination:  Regular rate and rhythm with a normal S1, S2.  There are no murmurs, rubs or bruits.  Abdomen:  Soft with good bowel sounds. There was no palpable abdominal mass.  There was no fluid wave.  There was no palpable hepatosplenomegaly.  Axillary:  Shows no bilateral axillary adenopathy.  Back:  No tenderness of the spine, ribs, or hips. Extremities:  Shows no clubbing, cyanosis or edema.  Skin:  No rashes, ecchymosis or petechiae.  LABORATORY STUDIES:  White cell count is 22.2, hemoglobin is 12.8, hematocrit 38.3, platelet count 251.  White cell differential shows 20 segs, 75 lymphocytes.  IMPRESSION:  Alicia Snow is a 56 year old white female with stage A  chronic lymphocytic leukemia.  We have been following her now for 4 years.  Her white cell count has been trending up very, very slowly.  There has been no indication at all that she needs to be treated or have any additional testing done.  We will go ahead and plan for another 3 month followup.  I do not see that we need to do any blood work in between visits.    ______________________________ Josph Macho, M.D. PRE/MEDQ  D:  06/14/2011  T:  06/15/2011  Job:  [id number]

## 2011-09-19 ENCOUNTER — Other Ambulatory Visit (HOSPITAL_BASED_OUTPATIENT_CLINIC_OR_DEPARTMENT_OTHER): Payer: BC Managed Care – PPO | Admitting: Lab

## 2011-09-19 ENCOUNTER — Ambulatory Visit (HOSPITAL_BASED_OUTPATIENT_CLINIC_OR_DEPARTMENT_OTHER): Payer: BC Managed Care – PPO | Admitting: Hematology & Oncology

## 2011-09-19 VITALS — BP 118/68 | HR 59 | Temp 98.4°F | Resp 18 | Ht 62.0 in | Wt 191.0 lb

## 2011-09-19 DIAGNOSIS — C9110 Chronic lymphocytic leukemia of B-cell type not having achieved remission: Secondary | ICD-10-CM

## 2011-09-19 LAB — CBC WITH DIFFERENTIAL (CANCER CENTER ONLY)
EOS%: 0.7 % (ref 0.0–7.0)
Eosinophils Absolute: 0.2 10*3/uL (ref 0.0–0.5)
LYMPH%: 72.2 % — ABNORMAL HIGH (ref 14.0–48.0)
MCH: 29.1 pg (ref 26.0–34.0)
MCHC: 33.1 g/dL (ref 32.0–36.0)
MCV: 88 fL (ref 81–101)
MONO%: 4.3 % (ref 0.0–13.0)
NEUT#: 4.9 10*3/uL (ref 1.5–6.5)
Platelets: 248 10*3/uL (ref 145–400)
RBC: 4.19 10*6/uL (ref 3.70–5.32)

## 2011-09-19 LAB — TECHNOLOGIST REVIEW CHCC SATELLITE

## 2011-09-19 LAB — CHCC SATELLITE - SMEAR

## 2011-09-19 NOTE — Patient Instructions (Signed)
Call if problems 

## 2011-09-19 NOTE — Progress Notes (Signed)
This office note has been dictated.

## 2011-09-20 NOTE — Progress Notes (Signed)
CC:   Jacqualine Mau, MD  DIAGNOSIS:  Chronic lymphocytic leukemia, stage A.  CURRENT THERAPY:  Observation.  INTERIM HISTORY:  Alicia Snow comes in for followup.  We saw her back in June.  She is losing weight.  She is exercising a lot.  She feels better.  She is still working quite a bit.  She has had no problems with fever. There are no swollen lymph glands. She has had no change in bowel or bladder habits.  She has not noticed any leg swelling.  She has had no unusual rashes.  There is no headache.  She does have a lot of stress with what is going on with her husband and his health issues.  PHYSICAL EXAMINATION:  This is a well-developed, well-nourished white female in no obvious distress.  Vital signs:  Temperature of 98.4, pulse 59, respiratory rate 18, blood pressure 118/68.  Weight is 191.  Head and neck:  Normocephalic, atraumatic skull.  There are no ocular or oral lesions.  There are no palpable cervical or supraclavicular lymph nodes. Lungs:  Clear bilaterally.  Cardiac:  Regular rate and rhythm with a normal S1 and S2.  There are no murmurs, rubs or bruits.  Abdomen:  Soft with good bowel sounds.  There is no palpable abdominal mass.  No palpable hepatosplenomegaly is noted.  There is no inguinal adenopathy bilaterally.  Axillary exam shows no axillary adenopathy.  Extremities: No clubbing, cyanosis or edema.  Neurological:  No focal neurological deficits.  LABORATORY STUDIES:  White cell count is 21.7, hemoglobin 13, hematocrit 39, platelet count 200.  IMPRESSION:  Ms. Cauthen is a 56 year old white female with stage A chronic lymphocytic leukemia.  We have been seeing her now for over 4 years. Her white cell count has been going up very slowly but really has been stable.  She has had no problems with anemia or thrombocytopenia.  We will go ahead and get her back in 3 more months.    ______________________________ Josph Macho, M.D. PRE/MEDQ  D:  09/19/2011  T:   09/20/2011  Job:  [id number]

## 2011-12-19 ENCOUNTER — Ambulatory Visit: Payer: BC Managed Care – PPO | Admitting: Hematology & Oncology

## 2011-12-19 ENCOUNTER — Other Ambulatory Visit: Payer: BC Managed Care – PPO | Admitting: Lab

## 2011-12-31 ENCOUNTER — Ambulatory Visit (HOSPITAL_BASED_OUTPATIENT_CLINIC_OR_DEPARTMENT_OTHER): Payer: BC Managed Care – PPO | Admitting: Hematology & Oncology

## 2011-12-31 ENCOUNTER — Other Ambulatory Visit (HOSPITAL_BASED_OUTPATIENT_CLINIC_OR_DEPARTMENT_OTHER): Payer: BC Managed Care – PPO | Admitting: Lab

## 2011-12-31 VITALS — BP 121/72 | HR 72 | Temp 97.9°F | Resp 16 | Ht 62.0 in | Wt 192.0 lb

## 2011-12-31 DIAGNOSIS — C9110 Chronic lymphocytic leukemia of B-cell type not having achieved remission: Secondary | ICD-10-CM

## 2011-12-31 LAB — CBC WITH DIFFERENTIAL (CANCER CENTER ONLY)
BASO#: 0.1 10*3/uL (ref 0.0–0.2)
BASO%: 0.3 % (ref 0.0–2.0)
EOS%: 0.8 % (ref 0.0–7.0)
HCT: 37.8 % (ref 34.8–46.6)
HGB: 12.6 g/dL (ref 11.6–15.9)
MCH: 29 pg (ref 26.0–34.0)
MCHC: 33.3 g/dL (ref 32.0–36.0)
MONO%: 3.4 % (ref 0.0–13.0)
NEUT%: 18.3 % — ABNORMAL LOW (ref 39.6–80.0)
RDW: 13.2 % (ref 11.1–15.7)

## 2011-12-31 LAB — CHCC SATELLITE - SMEAR

## 2011-12-31 NOTE — Progress Notes (Signed)
This office note has been dictated.

## 2012-01-01 NOTE — Progress Notes (Signed)
CC:   Mickey Farber, MD  DIAGNOSIS:  Stage A chronic lymphocytic leukemia.  CURRENT THERAPY:  Observation.  INTERIM HISTORY:  Alicia Snow comes in for her followup.  We see her every 3 months.  She is doing well.  She is getting ready for Christmas.  She, unfortunately, is not exercising as much as she once had.  Hopefully, she will be able to get back to exercising after the holidays.  She has had no palpable lymph nodes.  She has had no fevers, sweats, or chills.  There have been no rashes.  She has had no cough or shortness of breath.  She is worried a little bit about her intestinal function. She is on probiotics.  PHYSICAL EXAMINATION:  General:  This is a mildly obese white female in no obvious distress.  Vital signs:  Temperature of 97.9, pulse 72, respiratory rate 16, blood pressure 121/72.  Weight is 192.  Head and neck:  Normocephalic, atraumatic skull.  There are no ocular or oral lesions.  There are no palpable cervical or supraclavicular lymph nodes. Axillary:  No bilateral axillary adenopathy.  Lungs:  Clear to percussion and auscultation bilaterally.  Cardiac:  Regular rate and rhythm with a normal S1and S2.  There are no murmurs, rubs, or bruits. Abdomen:  Soft with good bowel sounds.  There is no palpable abdominal mass.  There is a palpable hepatosplenomegaly.  Back:  No tenderness over the spine, ribs, or hips.  Extremities:  No clubbing, cyanosis, or edema.  Neurological:  No focal neurological deficits.  LABORATORY STUDIES:  White cell count is 23.6, hemoglobin 12.6, hematocrit 37.8, platelet count 236.  MCV is 87.  IMPRESSION:  Alicia Snow is a very charming 56 year old white female with stage A chronic lymphocytic leukemia.  We have been following her for over 4 years.  Again, her disease really has not changed.  She is not anemic or thrombocytopenic.  There is no lymphadenopathy that I have noted.  She likes to come back to see [place] every 3 months.  We will continue  to see her back every 3 months.    ______________________________ Josph Macho, M.D. PRE/MEDQ  D:  12/31/2011  T:  01/01/2012  Job:  [id number]

## 2012-03-21 ENCOUNTER — Telehealth: Payer: Self-pay | Admitting: Hematology & Oncology

## 2012-03-21 NOTE — Telephone Encounter (Signed)
Moved 3-24 to 4-2 left message

## 2012-03-31 ENCOUNTER — Other Ambulatory Visit: Payer: BC Managed Care – PPO | Admitting: Lab

## 2012-03-31 ENCOUNTER — Ambulatory Visit: Payer: BC Managed Care – PPO | Admitting: Hematology & Oncology

## 2012-04-09 ENCOUNTER — Ambulatory Visit (HOSPITAL_BASED_OUTPATIENT_CLINIC_OR_DEPARTMENT_OTHER): Payer: BC Managed Care – PPO | Admitting: Hematology & Oncology

## 2012-04-09 ENCOUNTER — Other Ambulatory Visit (HOSPITAL_BASED_OUTPATIENT_CLINIC_OR_DEPARTMENT_OTHER): Payer: BC Managed Care – PPO | Admitting: Lab

## 2012-04-09 VITALS — BP 123/73 | HR 65 | Temp 98.1°F | Resp 16 | Ht 62.0 in | Wt 186.0 lb

## 2012-04-09 DIAGNOSIS — C9110 Chronic lymphocytic leukemia of B-cell type not having achieved remission: Secondary | ICD-10-CM

## 2012-04-09 LAB — CBC WITH DIFFERENTIAL (CANCER CENTER ONLY)
BASO%: 0.5 % (ref 0.0–2.0)
EOS%: 1.1 % (ref 0.0–7.0)
MCH: 29.3 pg (ref 26.0–34.0)
MCHC: 33.4 g/dL (ref 32.0–36.0)
MONO%: 3.5 % (ref 0.0–13.0)
NEUT#: 3.6 10*3/uL (ref 1.5–6.5)
Platelets: 247 10*3/uL (ref 145–400)

## 2012-04-09 LAB — CHCC SATELLITE - SMEAR

## 2012-04-09 LAB — TECHNOLOGIST REVIEW CHCC SATELLITE

## 2012-04-09 NOTE — Progress Notes (Signed)
This office note has been dictated.

## 2012-04-10 NOTE — Progress Notes (Signed)
CC:   Mickey Farber, MD  INTERIM HISTORY:  Ms. Flitton comes in for her followup.  We see her every 3 months.  She is losing weight.  She is exercising.  She is really doing a great job of trying to keep herself in shape.  Unfortunately, her husband has been having a lot of problems with psychiatric issues.  She is trying to help deal with him.  She has had no sweats.  There have been no obvious swollen lymph nodes. There has been no change in bowel or bladder habits.  She is due for a mammogram this summer.  Her mother and grandmother both passed from breast cancer.  PHYSICAL EXAMINATION:  General:  This is a well-developed, well- nourished white female in no obvious distress.  Vital signs: Temperature of 98.1, pulse 65, respiratory rate 16, blood pressure 123/73.  Weight is 186.  Head and neck:  Normocephalic, atraumatic skull.  There are no ocular or oral lesions.  There are no palpable cervical or supraclavicular lymph nodes.  Lungs:  Clear bilaterally. Cardiac:  Regular rate and rhythm with a normal S1 and S2.  There are no murmurs, rubs, or bruits.  Axillary:  No bilateral axillary adenopathy. Abdomen:  Soft.  She is mildly obese.  She has good bowel sounds.  There is no fluid wave.  There is no palpable hepatosplenomegaly.  There is no palpable inguinal adenopathy.  Extremities:  No clubbing, cyanosis, or edema.  Neurological:  No focal neurological deficits.  Skin:  No rashes.  LABORATORY STUDIES:  White cell count 19.3, hemoglobin 12.4, hematocrit 37.1, platelet count 247.  White cell differential was 19 segs, 76 lymphs, 3 monos.  IMPRESSION:  Ms. Beaudin is a very nice 57 year old white female with chronic lymphocytic leukemia.  We have following her for 5 years.  She was diagnosed back in April 2009.  I have encouraged her a lot.  We share the scripture.  She is feeling well.  We will get her back in 4 months.  I think we can start moving her appointments out a little  bit more.    ______________________________ Josph Macho, M.D. PRE/MEDQ  D:  04/09/2012  T:  04/10/2012  Job:  [id number]

## 2012-08-08 ENCOUNTER — Other Ambulatory Visit (HOSPITAL_BASED_OUTPATIENT_CLINIC_OR_DEPARTMENT_OTHER): Payer: BC Managed Care – PPO | Admitting: Lab

## 2012-08-08 ENCOUNTER — Ambulatory Visit (HOSPITAL_BASED_OUTPATIENT_CLINIC_OR_DEPARTMENT_OTHER): Payer: BC Managed Care – PPO | Admitting: Hematology & Oncology

## 2012-08-08 VITALS — BP 131/75 | HR 73 | Temp 97.9°F | Resp 16 | Ht 62.0 in | Wt 180.0 lb

## 2012-08-08 DIAGNOSIS — C9110 Chronic lymphocytic leukemia of B-cell type not having achieved remission: Secondary | ICD-10-CM

## 2012-08-08 LAB — CBC WITH DIFFERENTIAL (CANCER CENTER ONLY)
BASO%: 0.2 % (ref 0.0–2.0)
EOS%: 0.6 % (ref 0.0–7.0)
LYMPH#: 15.3 10*3/uL — ABNORMAL HIGH (ref 0.9–3.3)
MCHC: 33.1 g/dL (ref 32.0–36.0)
NEUT#: 4.7 10*3/uL (ref 1.5–6.5)
NEUT%: 22.5 % — ABNORMAL LOW (ref 39.6–80.0)
RDW: 13 % (ref 11.1–15.7)

## 2012-08-08 NOTE — Progress Notes (Signed)
This office note has been dictated.

## 2012-08-08 NOTE — Progress Notes (Signed)
CC:   Alicia Farber, MD  FINAL DIAGNOSIS:  Chronic lymphocytic leukemia, stage A.  CURRENT THERAPY:  Observation.  INTERIM HISTORY:  [name] comes in for followup.  We see her every 4 months.  She is doing well.  She is exercising.  She is trying to lose weight.  She has lost 6 pounds since we last saw her.  She has had no fevers.  There has been no palpable lymph glands.  She has had no cough or shortness breath.  There has been no change in bowel or bladder habits.  She is due for a mammogram in September.  PHYSICAL EXAMINATION:  General:  This is a well-developed, well- nourished white female in no obvious distress.  Vital signs: Temperature of 97.9, pulse 73, respiratory rate 16, blood pressure 131/75.  Weight is 180.  Head and neck:  Normocephalic, atraumatic skull.  There are no ocular or oral lesions.  There are no palpable cervical or supraclavicular lymph nodes.  Lungs:  Clear to percussion and auscultation bilaterally.  Cardiac:  Regular rate and rhythm with a normal S1 and S2.  There are no murmurs, rubs or bruits.  Abdomen: Soft.  She has good bowel sounds.  There is no fluid wave.  There is no palpable hepatosplenomegaly.  Axillary:  Shows no bilateral axillary adenopathy.  Extremities:  Show no clubbing, cyanosis or edema.  Skin: No rashes, ecchymosis, or petechia.  LABORATORY STUDIES:  White cell count is 21, hemoglobin 13, hematocrit 39.3, platelet count 247.  White cell differential shows 23 segs and 73 lymphocytes.  IMPRESSION:  [name] is a very charming 57 year old white female.  She looks a lot younger.  She is in great shape.  We have been following her CLL now for over 5 years.  So far, this has not been an issue for [place].  I still do not see any indication that we have to treat her.  We will plan for another 4 month followup.  I think if all looks good in 4 months, then we should be able to get her back every 6 months  after that.    ______________________________ Josph Macho, M.D. PRE/MEDQ  D:  08/08/2012  T:  08/08/2012  Job:  [id number]

## 2012-12-08 ENCOUNTER — Telehealth: Payer: Self-pay | Admitting: Hematology & Oncology

## 2012-12-08 ENCOUNTER — Other Ambulatory Visit (HOSPITAL_BASED_OUTPATIENT_CLINIC_OR_DEPARTMENT_OTHER): Payer: BC Managed Care – PPO | Admitting: Lab

## 2012-12-08 ENCOUNTER — Ambulatory Visit (HOSPITAL_BASED_OUTPATIENT_CLINIC_OR_DEPARTMENT_OTHER): Payer: BC Managed Care – PPO | Admitting: Hematology & Oncology

## 2012-12-08 VITALS — BP 124/67 | HR 61 | Temp 98.3°F | Resp 14 | Ht 62.0 in | Wt 176.0 lb

## 2012-12-08 DIAGNOSIS — C9110 Chronic lymphocytic leukemia of B-cell type not having achieved remission: Secondary | ICD-10-CM

## 2012-12-08 DIAGNOSIS — Z1501 Genetic susceptibility to malignant neoplasm of breast: Secondary | ICD-10-CM

## 2012-12-08 LAB — CBC WITH DIFFERENTIAL (CANCER CENTER ONLY)
Eosinophils Absolute: 0.2 10*3/uL (ref 0.0–0.5)
HCT: 37.9 % (ref 34.8–46.6)
HGB: 12.4 g/dL (ref 11.6–15.9)
LYMPH%: 75 % — ABNORMAL HIGH (ref 14.0–48.0)
MCV: 89 fL (ref 81–101)
MONO#: 0.7 10*3/uL (ref 0.1–0.9)
Platelets: 242 10*3/uL (ref 145–400)
RBC: 4.25 10*6/uL (ref 3.70–5.32)
WBC: 20.2 10*3/uL — ABNORMAL HIGH (ref 3.9–10.0)

## 2012-12-08 LAB — CHCC SATELLITE - SMEAR

## 2012-12-08 NOTE — Telephone Encounter (Signed)
Pt aware of 12-11 MRI at GI. Per Roanna Raider pt aware she needs to take disc of mammogram from [facility] to them by 12-17-12

## 2012-12-08 NOTE — Progress Notes (Signed)
This office note has been dictated.

## 2012-12-14 NOTE — Progress Notes (Signed)
CC:   Mickey Farber, MD  DIAGNOSIS:  Chronic lymphocytic leukemia -- stage A.  CURRENT THERAPY:  Observation.  INTERIM HISTORY:  Alicia Snow comes in for a followup.  She is doing well. We last saw her back in August.  Since then, she has been losing weight. She has been exercising.  She is watching what she eats.  She is not taking any more gluten.  She believes that this is going to make a big difference with her.  She is back on Zoloft now.  This does help "even her out" and makes her feel better and less anxious.  Her husband is doing well.  This was a big stress for her.  He is now doing quite nicely.  She has had no problems with rashes.  There has been no fever.  There has been no sweats.  The patient is worried that her mammogram showed that she has very dense breast.  As such, she is probably going to need to have an MRI  just to try to get a better view of the breast tissue.  PHYSICAL EXAMINATION:  General:  This is a well-developed, well- nourished white female, in no obvious distress.  Vital Signs: Temperature of 98.3, pulse 61, respiratory rate 14, blood pressure 124/67.  Weight is 176 pounds.  Head and Neck:  Normocephalic, atraumatic skull.  There are no ocular or oral lesions.  There are no palpable cervical or supraclavicular lymph nodes.  Lungs:  Clear bilaterally.  Cardiac:  Regular rate and rhythm with a normal S1, S2. There are no murmurs, rubs, or bruits.  Abdomen:  Soft.  She has good bowel sounds.  There is no fluid wave.  There is no palpable abdominal mass.  There is no palpable hepatosplenomegaly.  Extremities:  No clubbing, cyanosis, or edema.  Neurologic:  No focal neurological deficits.  Skin:  No rashes, ecchymosis, or petechia.  LABORATORY STUDIES:  White cell count is 20.2, hemoglobin 12.4, hematocrit 37.9, platelet count 242.  White cell differential shows 20 segs, 75 lymphocytes.  IMPRESSION:  Alicia Snow is a very charming 57 year old white  female.  She has chronic lymphocytic leukemia.  We have been following her now for about 6 years.  Again, she has had no problems with the chronic lymphocytic leukemia.  We will go ahead and plan to get her back in another 4 months.  Again, we will also set up with MRI of the breast.    ______________________________ Josph Macho, M.D. PRE/MEDQ  D:  12/08/2012  T:  12/13/2012  Job:  [id number]

## 2012-12-17 ENCOUNTER — Encounter: Payer: Self-pay | Admitting: *Deleted

## 2012-12-18 ENCOUNTER — Ambulatory Visit
Admission: RE | Admit: 2012-12-18 | Discharge: 2012-12-18 | Disposition: A | Discharge status: Home / Self Care | Payer: BC Managed Care – PPO | Source: Ambulatory Visit | Attending: Hematology & Oncology | Admitting: Hematology & Oncology

## 2012-12-18 ENCOUNTER — Inpatient Hospital Stay
Admission: RE | Admit: 2012-12-18 | Discharge: 2012-12-18 | Disposition: A | Discharge status: Home / Self Care | Payer: BC Managed Care – PPO | Source: Ambulatory Visit | Attending: Hematology & Oncology | Admitting: Hematology & Oncology

## 2012-12-18 DIAGNOSIS — Z1501 Genetic susceptibility to malignant neoplasm of breast: Secondary | ICD-10-CM

## 2012-12-18 MED ORDER — GADOBENATE DIMEGLUMINE 529 MG/ML IV SOLN
15.0000 mL | Freq: Once | INTRAVENOUS | Status: AC | PRN
  Administered 2012-12-18: 15 mL via INTRAVENOUS

## 2012-12-30 ENCOUNTER — Other Ambulatory Visit: Payer: BC Managed Care – PPO

## 2013-04-06 ENCOUNTER — Ambulatory Visit (HOSPITAL_BASED_OUTPATIENT_CLINIC_OR_DEPARTMENT_OTHER): Payer: BC Managed Care – PPO | Admitting: Hematology & Oncology

## 2013-04-06 ENCOUNTER — Other Ambulatory Visit (HOSPITAL_BASED_OUTPATIENT_CLINIC_OR_DEPARTMENT_OTHER): Payer: BC Managed Care – PPO | Admitting: Lab

## 2013-04-06 ENCOUNTER — Encounter: Payer: Self-pay | Admitting: Hematology & Oncology

## 2013-04-06 VITALS — BP 119/75 | HR 68 | Temp 98.3°F | Resp 14 | Ht 62.0 in | Wt 185.0 lb

## 2013-04-06 DIAGNOSIS — C9110 Chronic lymphocytic leukemia of B-cell type not having achieved remission: Secondary | ICD-10-CM

## 2013-04-06 LAB — CBC WITH DIFFERENTIAL (CANCER CENTER ONLY)
BASO#: 0.1 10*3/uL (ref 0.0–0.2)
BASO%: 0.2 % (ref 0.0–2.0)
EOS ABS: 0.1 10*3/uL (ref 0.0–0.5)
EOS%: 0.7 % (ref 0.0–7.0)
HCT: 39.1 % (ref 34.8–46.6)
HEMOGLOBIN: 12.8 g/dL (ref 11.6–15.9)
LYMPH#: 15.9 10*3/uL — ABNORMAL HIGH (ref 0.9–3.3)
LYMPH%: 76.2 % — AB (ref 14.0–48.0)
MCH: 29.2 pg (ref 26.0–34.0)
MCHC: 32.7 g/dL (ref 32.0–36.0)
MCV: 89 fL (ref 81–101)
MONO#: 0.8 10*3/uL (ref 0.1–0.9)
MONO%: 3.6 % (ref 0.0–13.0)
NEUT#: 4 10*3/uL (ref 1.5–6.5)
NEUT%: 19.3 % — ABNORMAL LOW (ref 39.6–80.0)
Platelets: 235 10*3/uL (ref 145–400)
RBC: 4.38 10*6/uL (ref 3.70–5.32)
RDW: 13.2 % (ref 11.1–15.7)
WBC: 20.8 10*3/uL — ABNORMAL HIGH (ref 3.9–10.0)

## 2013-04-06 LAB — CHCC SATELLITE - SMEAR

## 2013-04-06 NOTE — Progress Notes (Signed)
Hematology and Oncology Follow Up Visit  Alicia Snow [id number] 04-23-1955 58 y.o. 04/06/2013   Principle Diagnosis:   Stage A CLL  Current Therapy:    Observation     Interim History:  [name] is back for followup. We saw her in December. We did get a breast MRI and mammogram. All came back normal.  She has gained weight. She's back on Zoloft. This seems to make her gain some weight.  She's working. She's had no problems with fatigue or weakness. There is no cough or shortness of breath. She's had no problems with infections over the winter time.  She's not noticed any palpable lymph glands.  There's been no rashes. She's had no bleeding. There's been no headache.  Medications: Current outpatient prescriptions:aspirin 81 MG EC tablet, Take 81 mg by mouth daily. Swallow whole., Disp: , Rfl: ;  FISH OIL-KRILL OIL PO, Take by mouth every morning., Disp: , Rfl: ;  Multiple Vitamins-Minerals (CENTRUM SILVER ULTRA WOMENS) TABS, Take by mouth every morning., Disp: , Rfl: ;  Probiotic Product (ALIGN) 4 MG CAPS, Take by mouth every morning., Disp: , Rfl:  sertraline (ZOLOFT) 100 MG tablet, Take 100 mg by mouth daily. , Disp: , Rfl: ;  VITAMIN D, ERGOCALCIFEROL, PO, Take by mouth every morning., Disp: , Rfl:   Allergies:  Allergies  Allergen Reactions  . Nitrofurantoin     Past Medical History, Surgical history, Social history, and Family History were reviewed and updated.  Review of Systems: As above  Physical Exam:  height is 5\' 2"  (1.575 m) and weight is 185 lb (83.915 kg). Her oral temperature is 98.3 F (36.8 C). Her blood pressure is 119/75 and her pulse is 68. Her respiration is 14.   Well-developed and well-nourished white female. Head and neck exam shows no ocular or oral lesions. She has no lymphadenopathy in the neck. Lungs are clear. Cardiac exam regular in rhythm with no murmurs rubs or bruits. Axillar exam shows no bilateral axillary adenopathy. Abdomen is soft. She  has good bowel sounds. There is no fluid wave. There is no palpable liver or spleen tip. Extremities no clubbing cyanosis or edema. Skin exam no rashes. Neurological exam no focal deficits.  Lab Results  Component Value Date   WBC 20.8* 04/06/2013   HGB 12.8 04/06/2013   HCT 39.1 04/06/2013   MCV 89 04/06/2013   PLT 235 04/06/2013     Chemistry      Component Value Date/Time   NA 143 03/23/2008 1607   K 4.4 03/23/2008 1607   CL 103 03/23/2008 1607   CO2 32 03/23/2008 1607   BUN 16 03/23/2008 1607   CREATININE 0.8 03/23/2008 1607      Component Value Date/Time   CALCIUM 9.7 03/23/2008 1607   ALKPHOS 90 03/23/2008 1607   AST 29 03/23/2008 1607   ALT 34 03/23/2008 1607   BILITOT 0.8 03/23/2008 1607         Impression and Plan: Alicia Snow is 58 year old white female. She has stage A CLL. We have been following her probably for for 5 years. We've not had any evidence of suggestion of progressive disease.  We will go ahead and plan for another followup in 3-4 months.  I do not see any indication for additional studies.  Volanda Napoleon, MD 3/30/20159:13 AM

## 2013-06-22 ENCOUNTER — Telehealth: Payer: Self-pay | Admitting: Internal Medicine

## 2013-06-22 NOTE — Telephone Encounter (Signed)
Patient states she has had nausea all weekend. States when she burps it helps some. Also, reports tightness under right breast. She does have a family history of heart disease. Requested patient to call her PCP for evaluation first due to chest pain. She will do this now.

## 2013-06-30 ENCOUNTER — Ambulatory Visit (INDEPENDENT_AMBULATORY_CARE_PROVIDER_SITE_OTHER): Payer: BC Managed Care – PPO | Admitting: Internal Medicine

## 2013-06-30 ENCOUNTER — Encounter: Payer: Self-pay | Admitting: Internal Medicine

## 2013-06-30 VITALS — BP 120/70 | HR 68 | Ht 62.0 in | Wt 188.1 lb

## 2013-06-30 DIAGNOSIS — R109 Unspecified abdominal pain: Secondary | ICD-10-CM

## 2013-06-30 DIAGNOSIS — K824 Cholesterolosis of gallbladder: Secondary | ICD-10-CM

## 2013-06-30 NOTE — Progress Notes (Signed)
Alicia Snow Jan 01, 1956 [id number]  Note: This dictation was prepared with Dragon digital system. Any transcriptional errors that result from this procedure are unintentional.   History of Present Illness:  This is a 58 year old, white female whom we have followed for many years for irritable bowel syndrome and questionable Crohn's disease which was never proven. She has a history of breast cancer and chronic lymphocytic leukemia. He is here today because of a several week history of right upper quadrant discomfort extending across the upper abdomen and associated with bloating. She has modified her diet to limit fried foods and her symptoms have improved somewhat. She denies nausea or vomiting although initially, she had some nausea associated with it. Her bowel habits have been constipated. She had prior colonoscopies in August 2005 and again in July 2012 which showed mild diverticulosis. Biopsies were negative for microscopic colitis. She also has a history of anal fissure in the 1990s which was repaired. A recent upper abdominal ultrasound showed a small gallbladder polyp measuring 2 mm. The common bile duct was 6 mm. A CT scan of the abdomen showed no active process including a normal liver, pancreas and gallbladder. Her liver function tests are normal.    Past Medical History  Diagnosis Date  . Anal fissure   . IBS (irritable bowel syndrome)   . Hemorrhoids   . CLL (chronic lymphoblastic leukemia)   . Nephrolithiasis   . Anxiety   . Hyperlipidemia     Past Surgical History  Procedure Laterality Date  . Anal fissure repair    . Excision of polyp and external anal tag    . Lithotripsy      Allergies  Allergen Reactions  . Macrobid [Nitrofurantoin]     Family history and social history have been reviewed.  Review of Systems: Positive for weight gain. Constipation  The remainder of the 10 point ROS is negative except as outlined in the H&P  Physical Exam: General Appearance  Well developed, overweight in no distress Eyes  Non icteric  HEENT  Non traumatic, normocephalic  Mouth No lesion, tongue papillated, no cheilosis Neck Supple without adenopathy, thyroid not enlarged, no carotid bruits, no JVD Lungs Clear to auscultation bilaterally COR Normal S1, normal S2, regular rhythm, no murmur, quiet precordium Abdomen protuberant soft mildly tender along her right middle quadrant right upper quadrant across the epigastrium. Left lower and upper quadrants unremarkable Rectal moderate amount of soft Hemoccult negative stool Extremities  No pedal edema Skin No lesions Neurological Alert and oriented x 3 Psychological Normal mood and affect  Assessment and Plan:   Problem #7 58 year old white female with vague right upper and middle quadrant abdominal discomfort which is likely related to constipation rather than to biliary dysfunction. We will proceed with a HIDA scan to assess her gallbladder function, but I feel that her symptoms are more related to GI motility. She will start MiraLax 17 g daily for 3 days and will then reduce it to 9 g 2-3 times a week. She is up-to-date on her colonoscopy. We will await results of the HIDA scan before making a referral for cholecystectomy.    Alicia Snow 06/30/2013

## 2013-06-30 NOTE — Patient Instructions (Addendum)
You have been scheduled for a HIDA scan at [facility] (1st floor) on Monday 07/20/13. Please arrive 15 minutes prior to your scheduled appointment at  9:62 pm. Make certain not to have anything to eat or drink at least 6 hours prior to your test. Should this appointment date or time not work well for you, please call radiology scheduling at [phone].  _____________________________________________________________________ hepatobiliary (HIDA) scan is an imaging procedure used to diagnose problems in the liver, gallbladder and bile ducts. In the HIDA scan, a radioactive chemical or tracer is injected into a vein in your arm. The tracer is handled by the liver like bile. Bile is a fluid produced and excreted by your liver that helps your digestive system break down fats in the foods you eat. Bile is stored in your gallbladder and the gallbladder releases the bile when you eat a meal. A special nuclear medicine scanner (gamma camera) tracks the flow of the tracer from your liver into your gallbladder and small intestine.  During your HIDA scan  You'll be asked to change into a hospital gown before your HIDA scan begins. Your health care team will position you on a table, usually on your back. The radioactive tracer is then injected into a vein in your arm.The tracer travels through your bloodstream to your liver, where it's taken up by the bile-producing cells. The radioactive tracer travels with the bile from your liver into your gallbladder and through your bile ducts to your small intestine.You may feel some pressure while the radioactive tracer is injected into your vein. As you lie on the table, a special gamma camera is positioned over your abdomen taking pictures of the tracer as it moves through your body. The gamma camera takes pictures continually for about an hour. You'll need to keep still during the HIDA scan. This can become uncomfortable, but you may find that you can lessen the  discomfort by taking deep breaths and thinking about other things. Tell your health care team if you're uncomfortable. The radiologist will watch on a computer the progress of the radioactive tracer through your body. The HIDA scan may be stopped when the radioactive tracer is seen in the gallbladder and enters your small intestine. This typically takes about an hour. In some cases extra imaging will be performed if original images aren't satisfactory, if morphine is given to help visualize the gallbladder or if the medication CCK is given to look at the contraction of the gallbladder. This test typically takes 2 hours to complete. ________________________________________________________________________  Please purchase the following medications over the counter and take as directed: Miralax 17 grams (1 capful) daily x 3 days, then take 1/2 capful (9 grams) 2 times weekly thereafter  CC:Dr Rochel Brome

## 2013-07-20 ENCOUNTER — Encounter (HOSPITAL_COMMUNITY): Payer: BC Managed Care – PPO

## 2013-08-03 ENCOUNTER — Encounter: Payer: Self-pay | Admitting: Hematology & Oncology

## 2013-08-03 ENCOUNTER — Ambulatory Visit (HOSPITAL_BASED_OUTPATIENT_CLINIC_OR_DEPARTMENT_OTHER): Payer: BC Managed Care – PPO | Admitting: Hematology & Oncology

## 2013-08-03 ENCOUNTER — Other Ambulatory Visit (HOSPITAL_BASED_OUTPATIENT_CLINIC_OR_DEPARTMENT_OTHER): Payer: BC Managed Care – PPO | Admitting: Lab

## 2013-08-03 VITALS — BP 133/78 | HR 69 | Temp 98.3°F | Resp 14 | Ht 62.0 in | Wt 188.0 lb

## 2013-08-03 DIAGNOSIS — Z87898 Personal history of other specified conditions: Secondary | ICD-10-CM

## 2013-08-03 DIAGNOSIS — C9110 Chronic lymphocytic leukemia of B-cell type not having achieved remission: Secondary | ICD-10-CM

## 2013-08-03 LAB — CBC WITH DIFFERENTIAL (CANCER CENTER ONLY)
BASO#: 0.1 10*3/uL (ref 0.0–0.2)
BASO%: 0.3 % (ref 0.0–2.0)
EOS%: 0.8 % (ref 0.0–7.0)
Eosinophils Absolute: 0.2 10*3/uL (ref 0.0–0.5)
HEMATOCRIT: 38.9 % (ref 34.8–46.6)
HGB: 12.9 g/dL (ref 11.6–15.9)
LYMPH#: 13.8 10*3/uL — ABNORMAL HIGH (ref 0.9–3.3)
LYMPH%: 76.6 % — AB (ref 14.0–48.0)
MCH: 29.7 pg (ref 26.0–34.0)
MCHC: 33.2 g/dL (ref 32.0–36.0)
MCV: 89 fL (ref 81–101)
MONO#: 0.6 10*3/uL (ref 0.1–0.9)
MONO%: 3.4 % (ref 0.0–13.0)
NEUT#: 3.4 10*3/uL (ref 1.5–6.5)
NEUT%: 18.9 % — AB (ref 39.6–80.0)
PLATELETS: 223 10*3/uL (ref 145–400)
RBC: 4.35 10*6/uL (ref 3.70–5.32)
RDW: 13.1 % (ref 11.1–15.7)
WBC: 18 10*3/uL — ABNORMAL HIGH (ref 3.9–10.0)

## 2013-08-03 LAB — TECHNOLOGIST REVIEW CHCC SATELLITE

## 2013-08-03 LAB — CHCC SATELLITE - SMEAR

## 2013-08-03 NOTE — Progress Notes (Signed)
Hematology and Oncology Follow Up Visit  Alicia Snow [id number] 10/07/1955 58 y.o. 08/03/2013   Principle Diagnosis:  Stage A CLL  Current Therapy:    Observation     Interim History:  Ms.  Snow is  Back for followup. We see her every 4 months. She's doing well. She is working. She's working quite hard.. She's exercising. She is not lost weight but she's not gain weight. She is watching what she eats.  Her husband is doing better. He was having a lot of mental issues. He now is on medication that is helping.  She's had no problems with fever. She had no palpable lymph glands. She did have a problem with her abdomen. She thought she may have had a gallbladder issue. She had an ultrasound. She had a CT scan done. There were no pathologically enlarged lymph nodes. Her spleen looked okay. There was no abnormal gallbladder or liver findings.  She's had no change in bowel or bladder habits.  There's been no skin rashes. She's had no bleeding.  Medications: Current outpatient prescriptions:aspirin 81 MG EC tablet, Take 81 mg by mouth daily. Swallow whole., Disp: , Rfl: ;  FISH OIL-KRILL OIL PO, Take by mouth every morning., Disp: , Rfl: ;  Multiple Vitamins-Minerals (CENTRUM SILVER ULTRA WOMENS) TABS, Take by mouth every morning., Disp: , Rfl: ;  Probiotic Product (ALIGN) 4 MG CAPS, Take by mouth every morning., Disp: , Rfl: ;  VITAMIN D, ERGOCALCIFEROL, PO, Take by mouth every morning., Disp: , Rfl:   Allergies:  Allergies  Allergen Reactions  . Macrobid [Nitrofurantoin]     Past Medical History, Surgical history, Social history, and Family History were reviewed and updated.  Review of Systems: As above  Physical Exam:  height is 5\' 2"  (1.575 m) and weight is 188 lb (85.276 kg). Her oral temperature is 98.3 F (36.8 C). Her blood pressure is 133/78 and her pulse is 69. Her respiration is 14.   Well-developed and well-nourished white female in no obvious distress. Head and neck exam  shows no ocular or oral lesions. She has no adenopathy in the neck. Lungs are clear. Cardiac exam regular in rhythm with no murmurs rubs or bruits. Abdomen is soft. Has good bowel sounds. She is no fluid wave. There is no palpable liver or spleen tip. Back exam no tenderness over the spine ribs or hips. Extremities shows no clubbing cyanosis or edema. Skin exam no rashes. Neurological exam is nonfocal.  Lab Results  Component Value Date   WBC 18.0* 08/03/2013   HGB 12.9 08/03/2013   HCT 38.9 08/03/2013   MCV 89 08/03/2013   PLT 223 08/03/2013     Chemistry      Component Value Date/Time   NA 143 03/23/2008 1607   K 4.4 03/23/2008 1607   CL 103 03/23/2008 1607   CO2 32 03/23/2008 1607   BUN 16 03/23/2008 1607   CREATININE 0.8 03/23/2008 1607      Component Value Date/Time   CALCIUM 9.7 03/23/2008 1607   ALKPHOS 90 03/23/2008 1607   AST 29 03/23/2008 1607   ALT 34 03/23/2008 1607   BILITOT 0.8 03/23/2008 1607         Impression and Plan: Alicia Snow is a 58 year old white female with a CLL. We have been following her now for I think 6 years or more. She's done incredibly well. There is also no indication for therapy.  We will plan to get her back now in 6 months.  I think we can go every 6 months.  Again, am just thankful that her husband is doing better. She will be married 30 years next May.   Volanda Napoleon, MD 7/27/201510:55 AM

## 2013-09-29 ENCOUNTER — Other Ambulatory Visit: Payer: Self-pay

## 2013-09-29 DIAGNOSIS — Z1231 Encounter for screening mammogram for malignant neoplasm of breast: Secondary | ICD-10-CM

## 2013-10-08 ENCOUNTER — Encounter: Payer: Self-pay | Admitting: Internal Medicine

## 2013-10-14 ENCOUNTER — Ambulatory Visit
Admission: RE | Admit: 2013-10-14 | Discharge: 2013-10-14 | Disposition: A | Discharge status: Home / Self Care | Payer: BC Managed Care – PPO | Source: Ambulatory Visit

## 2013-10-14 DIAGNOSIS — Z1231 Encounter for screening mammogram for malignant neoplasm of breast: Secondary | ICD-10-CM

## 2013-10-16 ENCOUNTER — Other Ambulatory Visit: Payer: Self-pay | Admitting: Obstetrics and Gynecology

## 2013-10-16 DIAGNOSIS — R928 Other abnormal and inconclusive findings on diagnostic imaging of breast: Secondary | ICD-10-CM

## 2013-10-27 ENCOUNTER — Ambulatory Visit
Admission: RE | Admit: 2013-10-27 | Discharge: 2013-10-27 | Disposition: A | Discharge status: Home / Self Care | Payer: BC Managed Care – PPO | Source: Ambulatory Visit | Attending: Obstetrics and Gynecology | Admitting: Obstetrics and Gynecology

## 2013-10-27 DIAGNOSIS — R928 Other abnormal and inconclusive findings on diagnostic imaging of breast: Secondary | ICD-10-CM

## 2014-02-01 ENCOUNTER — Ambulatory Visit (HOSPITAL_BASED_OUTPATIENT_CLINIC_OR_DEPARTMENT_OTHER): Payer: BLUE CROSS/BLUE SHIELD | Admitting: Hematology & Oncology

## 2014-02-01 ENCOUNTER — Encounter: Payer: Self-pay | Admitting: Hematology & Oncology

## 2014-02-01 ENCOUNTER — Other Ambulatory Visit (HOSPITAL_BASED_OUTPATIENT_CLINIC_OR_DEPARTMENT_OTHER): Payer: BLUE CROSS/BLUE SHIELD | Admitting: Lab

## 2014-02-01 VITALS — BP 134/78 | HR 67 | Temp 97.6°F | Resp 16 | Wt 184.0 lb

## 2014-02-01 DIAGNOSIS — C9110 Chronic lymphocytic leukemia of B-cell type not having achieved remission: Secondary | ICD-10-CM

## 2014-02-01 LAB — CBC WITH DIFFERENTIAL (CANCER CENTER ONLY)
BASO#: 0.1 10*3/uL (ref 0.0–0.2)
BASO%: 0.4 % (ref 0.0–2.0)
EOS%: 1.4 % (ref 0.0–7.0)
Eosinophils Absolute: 0.2 10*3/uL (ref 0.0–0.5)
HCT: 39.3 % (ref 34.8–46.6)
HEMOGLOBIN: 12.8 g/dL (ref 11.6–15.9)
LYMPH#: 9.8 10*3/uL — ABNORMAL HIGH (ref 0.9–3.3)
LYMPH%: 66 % — ABNORMAL HIGH (ref 14.0–48.0)
MCH: 29 pg (ref 26.0–34.0)
MCHC: 32.6 g/dL (ref 32.0–36.0)
MCV: 89 fL (ref 81–101)
MONO#: 0.6 10*3/uL (ref 0.1–0.9)
MONO%: 4.2 % (ref 0.0–13.0)
NEUT#: 4.1 10*3/uL (ref 1.5–6.5)
NEUT%: 28 % — ABNORMAL LOW (ref 39.6–80.0)
PLATELETS: 226 10*3/uL (ref 145–400)
RBC: 4.42 10*6/uL (ref 3.70–5.32)
RDW: 13 % (ref 11.1–15.7)
WBC: 14.8 10*3/uL — AB (ref 3.9–10.0)

## 2014-02-01 LAB — CHCC SATELLITE - SMEAR

## 2014-02-01 NOTE — Progress Notes (Signed)
Hematology and Oncology Follow Up Visit  Alicia Snow [id number] Jun 06, 1955 59 y.o. 02/01/2014   Principle Diagnosis:  Stage A CLL  Current Therapy:    Observation     Interim History:  Ms.  Snow is back for followup. We see her every 6 months. She's doing well. She is working. She's working quite hard.. She's exercising. She has lost a little bit of weight.  Yesterday he Her husband is doing better. He was having a lot of mental issues. He now is on medication that is helping.  She's had no problems with fever. She had no palpable lymph glands.   In October, she had a scare with her breast. She really had an abnormal mammogram. This was followed up and apparently everything turned out okay.  She's had no change in bowel or bladder habits.  There's been no skin rashes. She's had no bleeding.  Medications:  Current outpatient prescriptions:  .  aspirin 81 MG EC tablet, Take 81 mg by mouth daily. Swallow whole., Disp: , Rfl:  .  FISH OIL-KRILL OIL PO, Take by mouth every morning., Disp: , Rfl:  .  Multiple Vitamins-Minerals (CENTRUM SILVER ULTRA WOMENS) TABS, Take by mouth every morning., Disp: , Rfl:  .  Probiotic Product (ALIGN) 4 MG CAPS, Take by mouth every morning., Disp: , Rfl:  .  VITAMIN D, ERGOCALCIFEROL, PO, Take by mouth every morning., Disp: , Rfl:   Allergies:  Allergies  Allergen Reactions  . Macrobid [Nitrofurantoin]     Past Medical History, Surgical history, Social history, and Family History were reviewed and updated.  Review of Systems: As above  Physical Exam:  weight is 184 lb (83.462 kg). Her oral temperature is 97.6 F (36.4 C). Her blood pressure is 134/78 and her pulse is 67. Her respiration is 16.   Well-developed and well-nourished white female in no obvious distress. Head and neck exam shows no ocular or oral lesions. She has no adenopathy in the neck. Lungs are clear. Cardiac exam regular rate and rhythm with no murmurs rubs or bruits. Abdomen  is soft. Has good bowel sounds. She is no fluid wave. There is no palpable liver or spleen tip. Back exam no tenderness over the spine ribs or hips. Extremities shows no clubbing cyanosis or edema. Skin exam no rashes. Neurological exam is nonfocal.  Lab Results  Component Value Date   WBC 14.8* 02/01/2014   HGB 12.8 02/01/2014   HCT 39.3 02/01/2014   MCV 89 02/01/2014   PLT 226 02/01/2014     Chemistry      Component Value Date/Time   NA 143 03/23/2008 1607   K 4.4 03/23/2008 1607   CL 103 03/23/2008 1607   CO2 32 03/23/2008 1607   BUN 16 03/23/2008 1607   CREATININE 0.8 03/23/2008 1607      Component Value Date/Time   CALCIUM 9.7 03/23/2008 1607   ALKPHOS 90 03/23/2008 1607   AST 29 03/23/2008 1607   ALT 34 03/23/2008 1607   BILITOT 0.8 03/23/2008 1607         Impression and Plan: Alicia Snow is a 59 year old white female with a CLL. We have been following her now for I think 7 years or more. She's done incredibly well. There is also no indication for therapy.  We will plan to get her back now in 6 months. I think we can go every 6 months.  Again, am just thankful that her husband is doing better.   ENNEVER,PETER R,  MD 1/25/201610:10 AM

## 2014-08-02 ENCOUNTER — Ambulatory Visit (HOSPITAL_BASED_OUTPATIENT_CLINIC_OR_DEPARTMENT_OTHER): Payer: BLUE CROSS/BLUE SHIELD | Admitting: Hematology & Oncology

## 2014-08-02 ENCOUNTER — Other Ambulatory Visit (HOSPITAL_BASED_OUTPATIENT_CLINIC_OR_DEPARTMENT_OTHER): Payer: BLUE CROSS/BLUE SHIELD

## 2014-08-02 ENCOUNTER — Encounter: Payer: Self-pay | Admitting: Hematology & Oncology

## 2014-08-02 VITALS — BP 137/68 | HR 70 | Temp 97.9°F | Resp 16 | Ht 62.0 in | Wt 192.0 lb

## 2014-08-02 DIAGNOSIS — C9110 Chronic lymphocytic leukemia of B-cell type not having achieved remission: Secondary | ICD-10-CM

## 2014-08-02 LAB — CHCC SATELLITE - SMEAR

## 2014-08-02 LAB — CBC WITH DIFFERENTIAL (CANCER CENTER ONLY)
BASO#: 0.1 10*3/uL (ref 0.0–0.2)
BASO%: 0.3 % (ref 0.0–2.0)
EOS ABS: 0.2 10*3/uL (ref 0.0–0.5)
EOS%: 1 % (ref 0.0–7.0)
HCT: 37.6 % (ref 34.8–46.6)
HEMOGLOBIN: 12.6 g/dL (ref 11.6–15.9)
LYMPH#: 15.5 10*3/uL — ABNORMAL HIGH (ref 0.9–3.3)
LYMPH%: 73.9 % — ABNORMAL HIGH (ref 14.0–48.0)
MCH: 29.9 pg (ref 26.0–34.0)
MCHC: 33.5 g/dL (ref 32.0–36.0)
MCV: 89 fL (ref 81–101)
MONO#: 0.7 10*3/uL (ref 0.1–0.9)
MONO%: 3.5 % (ref 0.0–13.0)
NEUT#: 4.5 10*3/uL (ref 1.5–6.5)
NEUT%: 21.3 % — AB (ref 39.6–80.0)
PLATELETS: 244 10*3/uL (ref 145–400)
RBC: 4.21 10*6/uL (ref 3.70–5.32)
RDW: 12.9 % (ref 11.1–15.7)
WBC: 21 10*3/uL — ABNORMAL HIGH (ref 3.9–10.0)
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
## 2014-10-01 ENCOUNTER — Other Ambulatory Visit: Payer: Self-pay

## 2014-10-01 DIAGNOSIS — Z1231 Encounter for screening mammogram for malignant neoplasm of breast: Secondary | ICD-10-CM

## 2014-10-25 ENCOUNTER — Ambulatory Visit
Admission: RE | Admit: 2014-10-25 | Discharge: 2014-10-25 | Disposition: A | Discharge status: Home / Self Care | Payer: BLUE CROSS/BLUE SHIELD | Source: Ambulatory Visit

## 2014-10-25 DIAGNOSIS — Z1231 Encounter for screening mammogram for malignant neoplasm of breast: Secondary | ICD-10-CM

## 2014-11-01 ENCOUNTER — Ambulatory Visit: Payer: BLUE CROSS/BLUE SHIELD

## 2015-01-31 ENCOUNTER — Other Ambulatory Visit (HOSPITAL_BASED_OUTPATIENT_CLINIC_OR_DEPARTMENT_OTHER): Payer: BLUE CROSS/BLUE SHIELD

## 2015-01-31 ENCOUNTER — Ambulatory Visit (HOSPITAL_BASED_OUTPATIENT_CLINIC_OR_DEPARTMENT_OTHER): Payer: BLUE CROSS/BLUE SHIELD | Admitting: Hematology & Oncology

## 2015-01-31 ENCOUNTER — Encounter: Payer: Self-pay | Admitting: Hematology & Oncology

## 2015-01-31 VITALS — BP 141/78 | HR 77 | Temp 97.5°F | Resp 16 | Ht 62.0 in | Wt 185.0 lb

## 2015-01-31 DIAGNOSIS — C9110 Chronic lymphocytic leukemia of B-cell type not having achieved remission: Secondary | ICD-10-CM

## 2015-01-31 LAB — CBC WITH DIFFERENTIAL (CANCER CENTER ONLY)
BASO#: 0.1 10*3/uL (ref 0.0–0.2)
BASO%: 0.3 % (ref 0.0–2.0)
EOS ABS: 0.2 10*3/uL (ref 0.0–0.5)
EOS%: 1 % (ref 0.0–7.0)
HCT: 39.4 % (ref 34.8–46.6)
HGB: 13.1 g/dL (ref 11.6–15.9)
LYMPH#: 13.4 10*3/uL — AB (ref 0.9–3.3)
LYMPH%: 76 % — ABNORMAL HIGH (ref 14.0–48.0)
MCH: 29.4 pg (ref 26.0–34.0)
MCHC: 33.2 g/dL (ref 32.0–36.0)
MCV: 89 fL (ref 81–101)
MONO#: 0.8 10*3/uL (ref 0.1–0.9)
MONO%: 4.6 % (ref 0.0–13.0)
NEUT%: 18.1 % — AB (ref 39.6–80.0)
NEUTROS ABS: 3.2 10*3/uL (ref 1.5–6.5)
Platelets: 228 10*3/uL (ref 145–400)
RBC: 4.45 10*6/uL (ref 3.70–5.32)
RDW: 12.9 % (ref 11.1–15.7)
WBC: 17.7 10*3/uL — AB (ref 3.9–10.0)

## 2015-01-31 LAB — CHCC SATELLITE - SMEAR

## 2015-01-31 LAB — CMP (CANCER CENTER ONLY)
ALT(SGPT): 27 U/L (ref 10–47)
AST: 28 U/L (ref 11–38)
Albumin: 4.2 g/dL (ref 3.3–5.5)
Alkaline Phosphatase: 71 U/L (ref 26–84)
BUN, Bld: 17 mg/dL (ref 7–22)
CHLORIDE: 103 meq/L (ref 98–108)
CO2: 29 meq/L (ref 18–33)
CREATININE: 1.1 mg/dL (ref 0.6–1.2)
Calcium: 10 mg/dL (ref 8.0–10.3)
Glucose, Bld: 98 mg/dL (ref 73–118)
Potassium: 4.3 mEq/L (ref 3.3–4.7)
SODIUM: 150 meq/L — AB (ref 128–145)
Total Bilirubin: 0.7 mg/dl (ref 0.20–1.60)
Total Protein: 7.2 g/dL (ref 6.4–8.1)

## 2015-01-31 LAB — TECHNOLOGIST REVIEW CHCC SATELLITE
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
## 2015-05-25 ENCOUNTER — Encounter (HOSPITAL_COMMUNITY): Payer: Self-pay

## 2015-05-25 ENCOUNTER — Emergency Department (HOSPITAL_COMMUNITY)
Admission: EM | Admit: 2015-05-25 | Discharge: 2015-05-25 | Disposition: A | Discharge status: Home / Self Care | Payer: BLUE CROSS/BLUE SHIELD | Attending: Emergency Medicine | Admitting: Emergency Medicine

## 2015-05-25 DIAGNOSIS — Z8719 Personal history of other diseases of the digestive system: Secondary | ICD-10-CM | POA: Diagnosis not present

## 2015-05-25 DIAGNOSIS — H8302 Labyrinthitis, left ear: Secondary | ICD-10-CM | POA: Diagnosis not present

## 2015-05-25 DIAGNOSIS — Z79899 Other long term (current) drug therapy: Secondary | ICD-10-CM | POA: Insufficient documentation

## 2015-05-25 DIAGNOSIS — R404 Transient alteration of awareness: Secondary | ICD-10-CM | POA: Diagnosis not present

## 2015-05-25 DIAGNOSIS — F419 Anxiety disorder, unspecified: Secondary | ICD-10-CM | POA: Diagnosis not present

## 2015-05-25 DIAGNOSIS — Z87442 Personal history of urinary calculi: Secondary | ICD-10-CM | POA: Insufficient documentation

## 2015-05-25 DIAGNOSIS — E785 Hyperlipidemia, unspecified: Secondary | ICD-10-CM | POA: Insufficient documentation

## 2015-05-25 DIAGNOSIS — Z7982 Long term (current) use of aspirin: Secondary | ICD-10-CM | POA: Diagnosis not present

## 2015-05-25 DIAGNOSIS — Z856 Personal history of leukemia: Secondary | ICD-10-CM | POA: Diagnosis not present

## 2015-05-25 DIAGNOSIS — R42 Dizziness and giddiness: Secondary | ICD-10-CM | POA: Diagnosis not present

## 2015-05-25 LAB — CBG MONITORING, ED: Glucose-Capillary: 100 mg/dL — ABNORMAL HIGH (ref 65–99)

## 2015-05-25 MED ORDER — MECLIZINE HCL 25 MG PO TABS: 25.0000 mg | ORAL_TABLET | Freq: Three times a day (TID) | ORAL | Status: DC | PRN

## 2015-05-25 MED ORDER — SODIUM CHLORIDE 0.9 % IV BOLUS (SEPSIS)
1000.0000 mL | Freq: Once | INTRAVENOUS | Status: AC
  Administered 2015-05-25: 1000 mL via INTRAVENOUS

## 2015-05-25 MED ORDER — DEXAMETHASONE 4 MG PO TABS
10.0000 mg | ORAL_TABLET | Freq: Once | ORAL | Status: AC
  Administered 2015-05-25: 10 mg via ORAL
  Filled 2015-05-25: qty 3

## 2015-05-25 MED ORDER — DIAZEPAM 5 MG/ML IJ SOLN
5.0000 mg | Freq: Once | INTRAMUSCULAR | Status: AC
  Administered 2015-05-25: 5 mg via INTRAVENOUS
  Filled 2015-05-25: qty 2

## 2015-05-25 NOTE — ED Provider Notes (Signed)
CSN: [id number]     Arrival date & time 05/25/15  1251 History   First MD Initiated Contact with Patient 05/25/15 1300     Chief Complaint  Patient presents with  . Dizziness     (Consider location/radiation/quality/duration/timing/severity/associated sxs/prior Treatment) Patient is a 60 y.o. Snow presenting with dizziness. The history is provided by the patient.  Dizziness Quality:  Head spinning Severity:  Severe Onset quality:  Sudden Duration:  1 hour Timing:  Constant Progression:  Unchanged Chronicity:  New Context: eye movement and head movement   Relieved by:  Being still Worsened by:  Eye movement, movement and turning head Ineffective treatments:  None tried Associated symptoms: nausea and vomiting   Associated symptoms: no chest pain, no headaches, no palpitations and no shortness of breath    60 yo F With a chief complaints of vertigo. Feels like the room spinning around her. 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 Having severe difficulty walking due to the spinning. Worse with head movement or eye movement. Denies head injury headache neck pain. Denies trouble with speech or coordination or focal weakness.  Past Medical History  Diagnosis Date  . Anal fissure   . IBS (irritable bowel syndrome)   . Hemorrhoids   . CLL (chronic lymphoblastic leukemia)   . Nephrolithiasis   . Anxiety   . Hyperlipidemia    Past Surgical History  Procedure Laterality Date  . Anal fissure repair    . Excision of polyp and external anal tag    . Lithotripsy     Family History  Problem Relation Age of Onset  . Breast cancer Mother 31    died of breast cancer  . Breast cancer Maternal Grandmother 35    died of breast cancer  . Breast cancer Paternal Grandmother     died of breast ca - age unknown  . Heart disease Father   . Heart disease Paternal Grandfather   . Diabetes Sister     x2  . Colon cancer Neg Hx   . Liver cancer Maternal Grandfather   . Diabetes  Brother   . Heart disease Paternal Grandmother   . Stroke Brother    Social History  Substance Use Topics  . Smoking status: Never Smoker   . Smokeless tobacco: Never Used     Comment: never used tobacco  . Alcohol Use: No   OB History    No data available     Review of Systems  Constitutional: Negative for fever and chills.  HENT: Negative for congestion and rhinorrhea.   Eyes: Negative for redness and visual disturbance.  Respiratory: Negative for shortness of breath and wheezing.   Cardiovascular: Negative for chest pain and palpitations.  Gastrointestinal: Positive for nausea and vomiting.  Genitourinary: Negative for dysuria and urgency.  Musculoskeletal: Negative for myalgias and arthralgias.  Skin: Negative for pallor and wound.  Neurological: Positive for dizziness. Negative for headaches.      Allergies  Macrobid  Home Medications   Prior to Admission medications   Medication Sig Start Date End Date Taking? Authorizing Provider  aspirin 81 MG EC tablet Take 81 mg by mouth daily. Swallow whole.   Yes Historical Provider, MD  buPROPion (WELLBUTRIN XL) 150 MG 24 hr tablet Take 150 mg by mouth daily.   Yes Historical Provider, MD  FISH OIL-KRILL OIL PO Take 1 tablet by mouth every morning.    Yes Historical Provider, MD  Multiple Vitamins-Minerals (CENTRUM SILVER ULTRA WOMENS) TABS Take 1  tablet by mouth daily.    Yes Historical Provider, MD  Probiotic Product (ALIGN) 4 MG CAPS Take 1 capsule by mouth daily.    Yes Historical Provider, MD  VITAMIN D, ERGOCALCIFEROL, PO Take 1 tablet by mouth daily.    Yes Historical Provider, MD  meclizine (ANTIVERT) 25 MG tablet Take 1 tablet (25 mg total) by mouth 3 (three) times daily as needed for dizziness. 05/25/15   Deno Etienne, DO   BP 132/76 mmHg  Pulse 66  Temp(Src) 98.1 F (36.7 C) (Oral)  Resp 18  Ht 5\' 2"  (1.575 m)  Wt 180 lb (81.647 kg)  BMI 32.91 kg/m2  SpO2 97%  LMP 01/08/2006 Physical Exam  Constitutional:  She is oriented to person, place, and time. She appears well-developed and well-nourished. No distress.  HENT:  Head: Normocephalic and atraumatic.  Eyes: EOM are normal. Pupils are equal, round, and reactive to light.  Neck: Normal range of motion. Neck supple.  Cardiovascular: Normal rate and regular rhythm.  Exam reveals no gallop and no friction rub.   No murmur heard. Pulmonary/Chest: Effort normal. She has no wheezes. She has no rales.  Abdominal: Soft. She exhibits no distension. There is no tenderness.  Musculoskeletal: She exhibits no edema or tenderness.  Neurological: She is alert and oriented to person, place, and time. She has normal strength. No cranial nerve deficit or sensory deficit. Coordination normal. GCS eye subscore is 4. GCS verbal subscore is 5. GCS motor subscore is 6. She displays no Babinski's sign on the right side. She displays no Babinski's sign on the left side.  Reflex Scores:      Tricep reflexes are 2+ on the right side and 2+ on the left side.      Bicep reflexes are 2+ on the right side and 2+ on the left side.      Brachioradialis reflexes are 2+ on the right side and 2+ on the left side.      Patellar reflexes are 2+ on the right side and 2+ on the left side.      Achilles reflexes are 2+ on the right side and 2+ on the left side. Skin: Skin is warm and dry. She is not diaphoretic.  Psychiatric: She has a normal mood and affect. Her behavior is normal.  Nursing note and vitals reviewed.   ED Course  Procedures (including critical care time) Labs Review Labs Reviewed - No data to display  Imaging Review No results found. I have personally reviewed and evaluated these images and lab results as part of my medical decision-making.   EKG Interpretation   Date/Time:  Wednesday May 25 2015 13:09:31 EDT Ventricular Rate:  71 PR Interval:  126 QRS Duration: 116 QT Interval:  405 QTC Calculation: 440 R Axis:   72 Text Interpretation:  Sinus rhythm  Nonspecific intraventricular conduction  delay No old tracing to compare Confirmed by Tess Potts MD, DANIEL [phone]) on  05/25/2015 1:19:Alicia PM      MDM   Final diagnoses:  Labyrinthitis of left ear    60 yo F with a chief complaints of vertigo. Peripheral by history. Patient has a effusion behind the left TM which she feels is stopped up. We'll treat her for labyrinthitis. Patient's symptoms improved significantly with Decadron and Valium. DC home on meclizine.  3:40 PM:  I have discussed the diagnosis/risks/treatment options with the patient and believe the pt to be eligible for discharge home to follow-up with PCP, ENT. We also discussed returning  to the ED immediately if new or worsening sx occur. We discussed the sx which are most concerning (e.g., sudden worsening pain, fever, inability to tolerate by mouth) that necessitate immediate return. Medications administered to the patient during their visit and any new prescriptions provided to the patient are listed below.  Medications given during this visit Medications  diazepam (VALIUM) injection 5 mg (5 mg Intravenous Given 05/25/15 1400)  sodium chloride 0.9 % bolus 1,000 mL (0 mLs Intravenous Stopped 05/25/15 1450)  dexamethasone (DECADRON) tablet 10 mg (10 mg Oral Given 05/25/15 1405)    Discharge Medication List as of 05/25/2015  2:36 PM    START taking these medications   Details  meclizine (ANTIVERT) 25 MG tablet Take 1 tablet (25 mg total) by mouth 3 (three) times daily as needed for dizziness., Starting 05/25/2015, Until Discontinued, Print        The patient appears reasonably screen and/or stabilized for discharge and I doubt any other medical condition or other [facility] requiring further screening, evaluation, or treatment in the ED at this time prior to discharge.      Deno Etienne, DO 05/25/15 1540

## 2015-05-25 NOTE — ED Notes (Signed)
Per EMS - pt from work. Sitting at computer, sudden onset dizziness/nausea. Hyperventilating and crying upon EMS arrival. Denies hx of vertigo or cardiac hx. Hx leukemia. Initially hypertensive. Last bp 114/60. Pt does not feel better. Symptoms worse w/ standing. Negative for orthostatic changes. 4mg  zofran w/o relief. Denies pain, shortness of breath, headache. Hx anxiety.

## 2015-05-25 NOTE — ED Notes (Signed)
Pt verbalized understanding of d/c instructions, prescriptions, and follow-up care. No further questions/concerns, VSS, ambulatory w/ steady gait (refused wheelchair) 

## 2015-05-25 NOTE — Discharge Instructions (Signed)
Labyrinthitis Labyrinthitis is an infection of the inner ear. Your inner ear is a system of tubes and canals (labyrinth). These are filled with fluid. Nerve cells in your inner ear send signals for hearing and balance to your brain. When tiny germs get inside the tubes and canals, they harm the cells that send messages to the brain. This can cause changes in hearing and balance. HOME CARE INSTRUCTIONS  Take medicines only as told by your doctor.  If you were prescribed an antibiotic medicine, finish all of it even if you start to feel better.  Rest as much as possible.  Avoid loud noises and bright lights.  Do not make sudden movements until any dizziness goes away.  Do not drive until your doctor says that you can.  Drink enough fluid to keep your pee (urine) clear or pale yellow.  Work with a physical therapist if you still feel dizzy after several weeks. A therapist can teach you exercises to help you deal with your dizziness.  Keep all follow-up visits as told by your doctor. This is important. GET HELP IF:  Your symptoms do not get better with medicines.  You do not get better after two weeks.  You have a fever. GET HELP RIGHT AWAY IF:  You are very dizzy.  You keep throwing up (vomiting) or keep feeling sick to your stomach (nauseous).  Your hearing gets a lot worse very quickly.   This information is not intended to replace advice given to you by your health care provider. Make sure you discuss any questions you have with your health care provider.   Document Released: 12/25/2004 Document Revised: 01/15/2014 Document Reviewed: 10/06/2013 Elsevier Interactive Patient Education 2016 [employer]  

## 2015-05-26 DIAGNOSIS — R42 Dizziness and giddiness: Secondary | ICD-10-CM | POA: Diagnosis not present

## 2015-05-26 DIAGNOSIS — H6502 Acute serous otitis media, left ear: Secondary | ICD-10-CM | POA: Diagnosis not present

## 2015-07-14 DIAGNOSIS — H8123 Vestibular neuronitis, bilateral: Secondary | ICD-10-CM | POA: Diagnosis not present

## 2015-08-01 ENCOUNTER — Other Ambulatory Visit (HOSPITAL_BASED_OUTPATIENT_CLINIC_OR_DEPARTMENT_OTHER): Payer: BLUE CROSS/BLUE SHIELD

## 2015-08-01 ENCOUNTER — Encounter: Payer: Self-pay | Admitting: Hematology & Oncology

## 2015-08-01 ENCOUNTER — Other Ambulatory Visit: Payer: Self-pay | Admitting: Hematology & Oncology

## 2015-08-01 ENCOUNTER — Ambulatory Visit (HOSPITAL_BASED_OUTPATIENT_CLINIC_OR_DEPARTMENT_OTHER): Payer: BLUE CROSS/BLUE SHIELD | Admitting: Hematology & Oncology

## 2015-08-01 VITALS — BP 143/79 | HR 72 | Temp 98.2°F | Resp 16 | Ht 62.0 in | Wt 192.0 lb

## 2015-08-01 DIAGNOSIS — C9110 Chronic lymphocytic leukemia of B-cell type not having achieved remission: Secondary | ICD-10-CM

## 2015-08-01 LAB — CBC WITH DIFFERENTIAL (CANCER CENTER ONLY)
BASO#: 0.1 10*3/uL (ref 0.0–0.2)
BASO%: 0.2 % (ref 0.0–2.0)
EOS ABS: 0.2 10*3/uL (ref 0.0–0.5)
EOS%: 0.6 % (ref 0.0–7.0)
HEMATOCRIT: 40.4 % (ref 34.8–46.6)
HEMOGLOBIN: 13.5 g/dL (ref 11.6–15.9)
LYMPH#: 20.1 10*3/uL — AB (ref 0.9–3.3)
LYMPH%: 75.4 % — ABNORMAL HIGH (ref 14.0–48.0)
MCH: 29.7 pg (ref 26.0–34.0)
MCHC: 33.4 g/dL (ref 32.0–36.0)
MCV: 89 fL (ref 81–101)
MONO#: 0.9 10*3/uL (ref 0.1–0.9)
MONO%: 3.3 % (ref 0.0–13.0)
NEUT#: 5.5 10*3/uL (ref 1.5–6.5)
NEUT%: 20.5 % — AB (ref 39.6–80.0)
Platelets: 256 10*3/uL (ref 145–400)
RBC: 4.54 10*6/uL (ref 3.70–5.32)
RDW: 13.1 % (ref 11.1–15.7)
WBC: 26.7 10*3/uL — ABNORMAL HIGH (ref 3.9–10.0)

## 2015-08-01 LAB — CMP (CANCER CENTER ONLY)
ALBUMIN: 4.2 g/dL (ref 3.3–5.5)
ALT(SGPT): 33 U/L (ref 10–47)
AST: 28 U/L (ref 11–38)
Alkaline Phosphatase: 83 U/L (ref 26–84)
BILIRUBIN TOTAL: 0.9 mg/dL (ref 0.20–1.60)
BUN, Bld: 18 mg/dL (ref 7–22)
CALCIUM: 9.4 mg/dL (ref 8.0–10.3)
CHLORIDE: 103 meq/L (ref 98–108)
CO2: 24 meq/L (ref 18–33)
Creat: 1 mg/dl (ref 0.6–1.2)
Glucose, Bld: 102 mg/dL (ref 73–118)
Potassium: 4.1 mEq/L (ref 3.3–4.7)
Sodium: 134 mEq/L (ref 128–145)
TOTAL PROTEIN: 7.1 g/dL (ref 6.4–8.1)

## 2015-08-01 LAB — LACTATE DEHYDROGENASE: LDH: 200 U/L (ref 125–245)

## 2015-08-01 LAB — CHCC SATELLITE - SMEAR

## 2015-08-01 NOTE — Progress Notes (Signed)
Hematology and Oncology Follow Up Visit  Alicia Snow [id number] 1955/06/29 60 y.o. 08/01/2015   Principle Diagnosis:  Stage A CLL  Current Therapy:    Observation     Interim History:  Alicia Snow is back for followup. She is doing quite well. We see her every 6 months. She's gained a little weight. She wants to try to lose the weight. This might be from medications that she takes.   She now has had some vertigo. Other she's had 2 episodes of vertigo. It just started a few months ago. She now is on meclizine.   She tries to walk every day. She tries to watch what she eats.   She has not noted any swollen glands. She's had no rashes. She's had no change in bowel or bladder habits. She's had no cough. She's had no fever. She's had no diaphoresis.   Her last mammogram was in October. Everything looked fine. She will be due for another one this October.  Her husband is doing okay.  Overall, her performance status is ECOG 0.  Medications:  Current Outpatient Prescriptions:  .  aspirin 81 MG EC tablet, Take 81 mg by mouth daily. Swallow whole., Disp: , Rfl:  .  FISH OIL-KRILL OIL PO, Take 1 tablet by mouth every morning. , Disp: , Rfl:  .  meclizine (ANTIVERT) 25 MG tablet, Take 1 tablet (25 mg total) by mouth 3 (three) times daily as needed for dizziness., Disp: 30 tablet, Rfl: 0 .  Multiple Vitamins-Minerals (CENTRUM SILVER ULTRA WOMENS) TABS, Take 1 tablet by mouth daily. , Disp: , Rfl:  .  Probiotic Product (ALIGN) 4 MG CAPS, Take 1 capsule by mouth daily. , Disp: , Rfl:  .  sertraline (ZOLOFT) 50 MG tablet, Take 50 mg by mouth daily., Disp: , Rfl:  .  VITAMIN D, ERGOCALCIFEROL, PO, Take 1 tablet by mouth daily. , Disp: , Rfl:   Allergies:  Allergies  Allergen Reactions  . Macrobid [Nitrofurantoin] Hives and Diarrhea    Past Medical History, Surgical history, Social history, and Family History were reviewed and updated.  Review of Systems: As above  Physical Exam:  height  is 5\' 2"  (1.575 m) and weight is 192 lb (87.1 kg). Her oral temperature is 98.2 F (36.8 C). Her blood pressure is 143/79 (abnormal) and her pulse is 72. Her respiration is 16.   Well-developed and well-nourished white female in no obvious distress. Head and neck exam shows no ocular or oral lesions. She has no adenopathy in the neck. Lungs are clear. Cardiac exam regular rate and rhythm with no murmurs rubs or bruits. Abdomen is soft. Has good bowel sounds. She is no fluid wave. There is no palpable liver or spleen tip. Back exam no tenderness over the spine ribs or hips. Extremities shows no clubbing cyanosis or edema. Skin exam abrasions that are healing on her right knee and elbow. She has no ecchymoses.. Neurological exam is nonfocal.  Lab Results  Component Value Date   WBC 26.7 (H) 08/01/2015   HGB 13.5 08/01/2015   HCT 40.4 08/01/2015   MCV 89 08/01/2015   PLT 256 08/01/2015     Chemistry      Component Value Date/Time   NA 150 (H) 01/31/2015 0939   K 4.3 01/31/2015 0939   CL 103 01/31/2015 0939   CO2 29 01/31/2015 0939   BUN 17 01/31/2015 0939   CREATININE 1.1 01/31/2015 0939      Component Value Date/Time  CALCIUM 10.0 01/31/2015 0939   ALKPHOS 71 01/31/2015 0939   AST 28 01/31/2015 0939   ALT 27 01/31/2015 0939   BILITOT 0.70 01/31/2015 0939         Impression and Plan: Ms. Tsai is a 60 year old white female with a CLL. We have been following her now for, think, 8 years or more. She's done incredibly well. There is also no indication for therapy.  Her white cell count is up a little bit. I told her that this still is not a problem. I do not feel any palpable lymph glands. She's had no splenomegaly. She's had no symptoms to suggest progressive disease.  I told her that she lost some of the extra weight, this might help with her white cell count.  I think that we will have to get her back now in about 3 or 4 months. I want to make sure that we maintain a low bit  closer contact with her and follow her blood counts more closely.  The fact that she is not anemic or thrombocytopenic is always a very good indicator.   Volanda Napoleon, MD 7/24/201710:06 AM

## 2015-08-02 LAB — PROTEIN ELECTROPHORESIS, SERUM, WITH REFLEX
A/G RATIO SPE: 1.5 (ref 0.7–1.7)
ALBUMIN: 4 g/dL (ref 2.9–4.4)
ALPHA 1: 0.2 g/dL (ref 0.0–0.4)
Alpha 2: 0.7 g/dL (ref 0.4–1.0)
BETA: 1.2 g/dL (ref 0.7–1.3)
GAMMA GLOBULIN: 0.4 g/dL (ref 0.4–1.8)
Globulin, Total: 2.6 g/dL (ref 2.2–3.9)
TOTAL PROTEIN: 6.6 g/dL (ref 6.0–8.5)

## 2015-09-28 ENCOUNTER — Other Ambulatory Visit: Payer: Self-pay | Admitting: Obstetrics and Gynecology

## 2015-09-28 DIAGNOSIS — Z1231 Encounter for screening mammogram for malignant neoplasm of breast: Secondary | ICD-10-CM

## 2015-10-03 DIAGNOSIS — R300 Dysuria: Secondary | ICD-10-CM | POA: Diagnosis not present

## 2015-10-03 DIAGNOSIS — R1084 Generalized abdominal pain: Secondary | ICD-10-CM | POA: Diagnosis not present

## 2015-10-03 DIAGNOSIS — K589 Irritable bowel syndrome without diarrhea: Secondary | ICD-10-CM | POA: Diagnosis not present

## 2015-10-11 ENCOUNTER — Other Ambulatory Visit: Payer: Self-pay | Admitting: Obstetrics and Gynecology

## 2015-10-11 DIAGNOSIS — Z780 Asymptomatic menopausal state: Secondary | ICD-10-CM

## 2015-10-11 DIAGNOSIS — Z124 Encounter for screening for malignant neoplasm of cervix: Secondary | ICD-10-CM | POA: Diagnosis not present

## 2015-10-11 DIAGNOSIS — R102 Pelvic and perineal pain: Secondary | ICD-10-CM | POA: Diagnosis not present

## 2015-10-11 DIAGNOSIS — Z01419 Encounter for gynecological examination (general) (routine) without abnormal findings: Secondary | ICD-10-CM | POA: Diagnosis not present

## 2015-10-13 DIAGNOSIS — N318 Other neuromuscular dysfunction of bladder: Secondary | ICD-10-CM | POA: Diagnosis not present

## 2015-10-13 DIAGNOSIS — R3121 Asymptomatic microscopic hematuria: Secondary | ICD-10-CM | POA: Diagnosis not present

## 2015-10-14 ENCOUNTER — Telehealth: Payer: Self-pay | Admitting: Gastroenterology

## 2015-10-14 ENCOUNTER — Encounter: Payer: Self-pay | Admitting: Gastroenterology

## 2015-10-14 ENCOUNTER — Encounter (INDEPENDENT_AMBULATORY_CARE_PROVIDER_SITE_OTHER): Payer: Self-pay

## 2015-10-14 ENCOUNTER — Ambulatory Visit (INDEPENDENT_AMBULATORY_CARE_PROVIDER_SITE_OTHER): Payer: BLUE CROSS/BLUE SHIELD | Admitting: Gastroenterology

## 2015-10-14 VITALS — BP 128/78 | HR 72 | Ht 62.0 in | Wt 186.1 lb

## 2015-10-14 DIAGNOSIS — R1032 Left lower quadrant pain: Secondary | ICD-10-CM

## 2015-10-14 DIAGNOSIS — R197 Diarrhea, unspecified: Secondary | ICD-10-CM | POA: Diagnosis not present

## 2015-10-14 DIAGNOSIS — K582 Mixed irritable bowel syndrome: Secondary | ICD-10-CM

## 2015-10-14 DIAGNOSIS — K5730 Diverticulosis of large intestine without perforation or abscess without bleeding: Secondary | ICD-10-CM | POA: Diagnosis not present

## 2015-10-14 MED ORDER — CIPROFLOXACIN HCL 500 MG PO TABS: 500.0000 mg | ORAL_TABLET | Freq: Two times a day (BID) | ORAL | 0 refills | Status: AC

## 2015-10-14 MED ORDER — METRONIDAZOLE 500 MG PO TABS: 500.0000 mg | ORAL_TABLET | Freq: Two times a day (BID) | ORAL | 0 refills | Status: AC

## 2015-10-14 NOTE — Patient Instructions (Addendum)
Please call us next Wed or Thurs with an update on your symptoms.  If you are age 59 or older, your body mass index should be between 23-30. Your Body mass index is 34.04 kg/m. If this is out of the aforementioned range listed, please consider follow up with your Primary Care Provider.  If you are age 41 or younger, your body mass index should be between 19-25. Your Body mass index is 34.04 kg/m. If this is out of the aformentioned range listed, please consider follow up with your Primary Care Provider.   Thank you for choosing [facility]  Dr Wilfrid Lund III

## 2015-10-14 NOTE — Progress Notes (Signed)
[facility] Progress Note  Chief Complaint: Abdominal pain and diarrhea  Subjective  History:  Alicia Snow Alicia Snow:"This is a 60 year old, white female whom we have followed for many years for irritable bowel syndrome and questionable Crohn's disease which was never proven. She has a history of breast cancer and chronic lymphocytic leukemia. He is here today because of a several week history of right upper quadrant discomfort extending across the upper abdomen and associated with bloating. She has modified her diet to limit fried foods and her symptoms have improved somewhat. She denies nausea or vomiting although initially, she had some nausea associated with it. Her bowel habits have been constipated. She had prior colonoscopies in August 2005 and again in July 2012 which showed mild diverticulosis. Biopsies were negative for microscopic colitis. She also has a history of anal fissure in the 1990s which was repaired. A recent upper abdominal ultrasound showed a small gallbladder polyp measuring 2 mm. The common bile duct was 6 mm. A CT scan of the abdomen showed no active process including a normal liver, pancreas and gallbladder. Her liver function tests are normal" She suspected probably functional.  Planning CCK-HIDA scan, no result. Last colonoscopy 2012 - mild diverticulosis, o/w nml.   She reports that since her last visit with Dr. Olevia Perches, she has very little trouble with her IBS in the way of abdominal pain or altered bowel habits. She will occasionally have some left lower quadrant or suprapubic pain, and usually has one stool per day. They are however thin and she sometimes needs to strain. She also feels that there may be a bulge in the perineal area and she has to put pressure on it to aid in passing a bowel movement. She does not feel anything prolapse. That he denies rectal bleeding for appetite is good and her weight stable. For the last 7-10 days she has had frequent left lower quadrant or  suprapubic pain as well as to semi-formed to loose stools per day with no rectal bleeding. She also feels that she may have a low-grade fever to 99.5.  ROS: Cardiovascular:  no chest pain Respiratory: no dyspnea  The patient's Past Medical, Family and Social History were reviewed and are on file in the EMR.  Objective:  Med list reviewed  Vital signs in last 24 hrs: Vitals:   10/14/15 1328  BP: 128/78  Pulse: 72    Physical Exam    HEENT: sclera anicteric, oral mucosa moist without lesions  Neck: supple, no thyromegaly, JVD or lymphadenopathy  Cardiac: RRR without murmurs, S1S2 heard, no peripheral edema  Pulm: clear to auscultation bilaterally, normal RR and effort noted  Abdomen: soft, mild bandlike lower abdominal tenderness, with active bowel sounds. No guarding or palpable hepatosplenomegaly.  Skin; warm and dry, no jaundice or rash  No recent data  @ASSESSMENTPLANBEGIN @ Assessment: Encounter Diagnoses  Name Primary?  Marland Kitchen LLQ abdominal pain Yes  . Diarrhea, unspecified type   . Irritable bowel syndrome with both constipation and diarrhea   . Diverticulosis of colon without hemorrhage     Difficult to tell if this is a flare of IBS or possible diverticulitis. She has not had a documented diverticulitis episode in the past.  Plan: 7 days of ciprofloxacin and Flagyl. She will call us mid to late next week. If she is not improved, then a trial of antispasmodic for probable IBS.   Total time 30 minutes, over half spent in counseling and coordination of care.   Nelida Meuse  III

## 2015-10-17 NOTE — Telephone Encounter (Signed)
Spoke to the pharmacy on 10-14-2015 and clarified RX dosing.

## 2015-10-21 ENCOUNTER — Ambulatory Visit: Payer: BLUE CROSS/BLUE SHIELD | Admitting: Gastroenterology

## 2015-10-24 DIAGNOSIS — R102 Pelvic and perineal pain: Secondary | ICD-10-CM | POA: Diagnosis not present

## 2015-10-26 ENCOUNTER — Ambulatory Visit
Admission: RE | Admit: 2015-10-26 | Discharge: 2015-10-26 | Disposition: A | Discharge status: Home / Self Care | Payer: BLUE CROSS/BLUE SHIELD | Source: Ambulatory Visit | Attending: Obstetrics and Gynecology | Admitting: Obstetrics and Gynecology

## 2015-10-26 DIAGNOSIS — Z1231 Encounter for screening mammogram for malignant neoplasm of breast: Secondary | ICD-10-CM

## 2015-11-01 DIAGNOSIS — Z23 Encounter for immunization: Secondary | ICD-10-CM | POA: Diagnosis not present

## 2015-11-07 ENCOUNTER — Encounter: Payer: Self-pay | Admitting: Hematology & Oncology

## 2015-11-07 ENCOUNTER — Other Ambulatory Visit (HOSPITAL_BASED_OUTPATIENT_CLINIC_OR_DEPARTMENT_OTHER): Payer: BLUE CROSS/BLUE SHIELD

## 2015-11-07 ENCOUNTER — Ambulatory Visit (HOSPITAL_BASED_OUTPATIENT_CLINIC_OR_DEPARTMENT_OTHER): Payer: BLUE CROSS/BLUE SHIELD | Admitting: Hematology & Oncology

## 2015-11-07 VITALS — BP 121/76 | HR 70 | Temp 97.2°F | Wt 181.2 lb

## 2015-11-07 DIAGNOSIS — C9110 Chronic lymphocytic leukemia of B-cell type not having achieved remission: Secondary | ICD-10-CM | POA: Diagnosis not present

## 2015-11-07 DIAGNOSIS — E7801 Familial hypercholesterolemia: Secondary | ICD-10-CM

## 2015-11-07 LAB — CBC WITH DIFFERENTIAL (CANCER CENTER ONLY)
BASO#: 0.2 10*3/uL (ref 0.0–0.2)
BASO%: 0.7 % (ref 0.0–2.0)
EOS%: 0.8 % (ref 0.0–7.0)
Eosinophils Absolute: 0.2 10*3/uL (ref 0.0–0.5)
HCT: 38.5 % (ref 34.8–46.6)
HGB: 12.8 g/dL (ref 11.6–15.9)
LYMPH#: 15 10*3/uL — ABNORMAL HIGH (ref 0.9–3.3)
LYMPH%: 69.9 % — AB (ref 14.0–48.0)
MCH: 29.3 pg (ref 26.0–34.0)
MCHC: 33.2 g/dL (ref 32.0–36.0)
MCV: 88 fL (ref 81–101)
MONO#: 0.8 10*3/uL (ref 0.1–0.9)
MONO%: 3.7 % (ref 0.0–13.0)
NEUT%: 24.9 % — AB (ref 39.6–80.0)
NEUTROS ABS: 5.3 10*3/uL (ref 1.5–6.5)
PLATELETS: 242 10*3/uL (ref 145–400)
RBC: 4.37 10*6/uL (ref 3.70–5.32)
RDW: 12.9 % (ref 11.1–15.7)
WBC: 21.4 10*3/uL — ABNORMAL HIGH (ref 3.9–10.0)

## 2015-11-07 LAB — COMPREHENSIVE METABOLIC PANEL
ALBUMIN: 4.3 g/dL (ref 3.5–5.0)
ALK PHOS: 88 U/L (ref 40–150)
ALT: 21 U/L (ref 0–55)
ANION GAP: 16 meq/L — AB (ref 3–11)
AST: 19 U/L (ref 5–34)
BUN: 23.9 mg/dL (ref 7.0–26.0)
CALCIUM: 9.5 mg/dL (ref 8.4–10.4)
CHLORIDE: 103 meq/L (ref 98–109)
CO2: 22 mEq/L (ref 22–29)
Creatinine: 0.9 mg/dL (ref 0.6–1.1)
EGFR: 72 mL/min/{1.73_m2} — ABNORMAL LOW (ref >90)
Glucose: 96 mg/dl (ref 70–140)
POTASSIUM: 3.9 meq/L (ref 3.5–5.1)
Sodium: 141 mEq/L (ref 136–145)
Total Bilirubin: 0.68 mg/dL (ref 0.20–1.20)
Total Protein: 6.9 g/dL (ref 6.4–8.3)

## 2015-11-07 LAB — TECHNOLOGIST REVIEW CHCC SATELLITE

## 2015-11-07 LAB — CHCC SATELLITE - SMEAR

## 2015-11-07 LAB — LACTATE DEHYDROGENASE: LDH: 184 U/L (ref 125–245)

## 2015-11-07 NOTE — Progress Notes (Signed)
Hematology and Oncology Follow Up Visit  Alicia Snow [id number] 10/11/1955 60 y.o. 11/07/2015   Principle Diagnosis:  Stage A CLL  Current Therapy:    Observation     Interim History:  [name] is back for followup. She is doing quite well. She has lost 11 pounds since we last saw her. She really is doing great job losing weight. She is happy that she is losing weight. She feels better. She feels more active. She feels more energetic.   Her work is going well. I think she works for a [employer]. She is quite busy.   She is not had any problem with fevers. She's had no rashes. She's had no swollen lymph nodes. She's had no bleeding or bruising.   There's been no change in bowel or bladder habits.   Her husband is doing okay.  Overall, her performance status is ECOG 0.  Medications:  Current Outpatient Prescriptions:  .  aspirin 81 MG EC tablet, Take 81 mg by mouth daily. Swallow whole., Disp: , Rfl:  .  FISH OIL-KRILL OIL PO, Take 1 tablet by mouth every morning. , Disp: , Rfl:  .  FLUoxetine (PROZAC) 20 MG capsule, Take 20 mg by mouth daily., Disp: , Rfl:  .  meclizine (ANTIVERT) 25 MG tablet, Take 1 tablet (25 mg total) by mouth 3 (three) times daily as needed for dizziness., Disp: 30 tablet, Rfl: 0 .  Multiple Vitamins-Minerals (CENTRUM SILVER ULTRA WOMENS) TABS, Take 1 tablet by mouth daily. , Disp: , Rfl:  .  Probiotic Product (ALIGN) 4 MG CAPS, Take 1 capsule by mouth daily. , Disp: , Rfl:  .  VITAMIN D, ERGOCALCIFEROL, PO, Take 1 tablet by mouth daily. , Disp: , Rfl:   Allergies:  Allergies  Allergen Reactions  . Macrobid [Nitrofurantoin] Hives and Diarrhea    Past Medical History, Surgical history, Social history, and Family History were reviewed and updated.  Review of Systems: As above  Physical Exam:  weight is 181 lb 3.2 oz (82.2 kg). Her oral temperature is 97.2 F (36.2 C). Her blood pressure is 121/76 and her pulse is 70.   Well-developed and  well-nourished white female in no obvious distress. Head and neck exam shows no ocular or oral lesions. She has no adenopathy in the neck. Lungs are clear. Cardiac exam regular rate and rhythm with no murmurs rubs or bruits. Abdomen is soft. Has good bowel sounds. She is no fluid wave. There is no palpable liver or spleen tip. Back exam no tenderness over the spine ribs or hips. Extremities shows no clubbing cyanosis or edema. Skin exam abrasions that are healing on her right knee and elbow. She has no ecchymoses.. Neurological exam is nonfocal.  Lab Results  Component Value Date   WBC 21.4 (H) 11/07/2015   HGB 12.8 11/07/2015   HCT 38.5 11/07/2015   MCV 88 11/07/2015   PLT 242 11/07/2015     Chemistry      Component Value Date/Time   NA 134 08/01/2015 0932   K 4.1 08/01/2015 0932   CL 103 08/01/2015 0932   CO2 24 08/01/2015 0932   BUN 18 08/01/2015 0932   CREATININE 1.0 08/01/2015 0932      Component Value Date/Time   CALCIUM 9.4 08/01/2015 0932   ALKPHOS 83 08/01/2015 0932   AST 28 08/01/2015 0932   ALT 33 08/01/2015 0932   BILITOT 0.90 08/01/2015 0932         Impression and  Plan: Alicia Snow is a 60 year old white female with a CLL. We have been following her now for, think,9 years or more. She's done incredibly well. There is also no indication for therapy.  tI am grateful that her white cell count come down a little bit. Also, this might be a sign that her CLL is not active. The fact that she has lost weight also is quite encouraging.   I think we can get her back now in 4 months. If always good in 4 months, then I think we can get her back in 6 months after that.   Her faith remains very strong.     Volanda Napoleon, MD 10/30/201711:28 AM

## 2015-11-08 LAB — LIPID PANEL
Chol/HDL Ratio: 4.3 ratio units (ref 0.0–4.4)
Cholesterol, Total: 267 mg/dL — ABNORMAL HIGH (ref 100–199)
HDL: 62 mg/dL (ref >39)
LDL Calculated: 182 mg/dL — ABNORMAL HIGH (ref 0–99)
Triglycerides: 117 mg/dL (ref 0–149)
VLDL Cholesterol Cal: 23 mg/dL (ref 5–40)

## 2015-11-08 LAB — BETA-2-GLYCOPROTEIN I ABS, IGG/M/A
Beta-2 Glyco 1 IgA: <9 GPI IgA units (ref 0–25)
Beta-2 Glyco 1 IgM: <9 GPI IgM units (ref 0–32)
Beta-2 Glycoprotein I Ab, IgG: <9 GPI IgG units (ref 0–20)

## 2015-11-08 LAB — IGG, IGA, IGM
IGG (IMMUNOGLOBIN G), SERUM: 505 mg/dL — AB (ref 700–1600)
IGM (IMMUNOGLOBIN M), SRM: 21 mg/dL — AB (ref 26–217)
IgA, Qn, Serum: 29 mg/dL — ABNORMAL LOW (ref 87–352)

## 2015-11-08 LAB — KAPPA/LAMBDA LIGHT CHAINS
Ig Kappa Free Light Chain: 10.8 mg/L (ref 3.3–19.4)
Ig Lambda Free Light Chain: 7.7 mg/L (ref 5.7–26.3)
Kappa/Lambda FluidC Ratio: 1.4 (ref 0.26–1.65)

## 2015-11-09 LAB — PROTEIN ELECTROPHORESIS, SERUM, WITH REFLEX
A/G Ratio: 1.6 (ref 0.7–1.7)
ALBUMIN: 4.1 g/dL (ref 2.9–4.4)
ALPHA 1: 0.2 g/dL (ref 0.0–0.4)
Alpha 2: 0.7 g/dL (ref 0.4–1.0)
Beta: 1.2 g/dL (ref 0.7–1.3)
Gamma Globulin: 0.5 g/dL (ref 0.4–1.8)
Globulin, Total: 2.6 g/dL (ref 2.2–3.9)
PDF: 0
Total Protein: 6.7 g/dL (ref 6.0–8.5)

## 2015-11-15 ENCOUNTER — Ambulatory Visit
Admission: RE | Admit: 2015-11-15 | Discharge: 2015-11-15 | Disposition: A | Discharge status: Home / Self Care | Payer: BLUE CROSS/BLUE SHIELD | Source: Ambulatory Visit | Attending: Obstetrics and Gynecology | Admitting: Obstetrics and Gynecology

## 2015-11-15 DIAGNOSIS — Z780 Asymptomatic menopausal state: Secondary | ICD-10-CM

## 2015-11-15 DIAGNOSIS — Z13820 Encounter for screening for osteoporosis: Secondary | ICD-10-CM | POA: Diagnosis not present

## 2015-12-15 DIAGNOSIS — Z1211 Encounter for screening for malignant neoplasm of colon: Secondary | ICD-10-CM | POA: Diagnosis not present

## 2015-12-15 DIAGNOSIS — Z6833 Body mass index (BMI) 33.0-33.9, adult: Secondary | ICD-10-CM | POA: Diagnosis not present

## 2015-12-15 DIAGNOSIS — Z0001 Encounter for general adult medical examination with abnormal findings: Secondary | ICD-10-CM | POA: Diagnosis not present

## 2015-12-15 DIAGNOSIS — H6982 Other specified disorders of Eustachian tube, left ear: Secondary | ICD-10-CM | POA: Diagnosis not present

## 2015-12-15 DIAGNOSIS — E782 Mixed hyperlipidemia: Secondary | ICD-10-CM | POA: Diagnosis not present

## 2016-02-13 DIAGNOSIS — H35363 Drusen (degenerative) of macula, bilateral: Secondary | ICD-10-CM | POA: Diagnosis not present

## 2016-02-13 DIAGNOSIS — H35362 Drusen (degenerative) of macula, left eye: Secondary | ICD-10-CM | POA: Diagnosis not present

## 2016-02-13 DIAGNOSIS — H04123 Dry eye syndrome of bilateral lacrimal glands: Secondary | ICD-10-CM | POA: Diagnosis not present

## 2016-02-13 DIAGNOSIS — H35361 Drusen (degenerative) of macula, right eye: Secondary | ICD-10-CM | POA: Diagnosis not present

## 2016-02-13 DIAGNOSIS — H04213 Epiphora due to excess lacrimation, bilateral lacrimal glands: Secondary | ICD-10-CM | POA: Diagnosis not present

## 2016-02-13 DIAGNOSIS — H2513 Age-related nuclear cataract, bilateral: Secondary | ICD-10-CM | POA: Diagnosis not present

## 2016-03-05 ENCOUNTER — Ambulatory Visit (HOSPITAL_BASED_OUTPATIENT_CLINIC_OR_DEPARTMENT_OTHER): Payer: BLUE CROSS/BLUE SHIELD | Admitting: Hematology & Oncology

## 2016-03-05 ENCOUNTER — Other Ambulatory Visit (HOSPITAL_BASED_OUTPATIENT_CLINIC_OR_DEPARTMENT_OTHER): Payer: BLUE CROSS/BLUE SHIELD

## 2016-03-05 VITALS — BP 126/77 | HR 75 | Temp 98.3°F | Resp 16 | Wt 185.0 lb

## 2016-03-05 DIAGNOSIS — C9110 Chronic lymphocytic leukemia of B-cell type not having achieved remission: Secondary | ICD-10-CM | POA: Diagnosis not present

## 2016-03-05 LAB — CBC WITH DIFFERENTIAL (CANCER CENTER ONLY)
BASO#: 0.1 10*3/uL (ref 0.0–0.2)
BASO%: 0.5 % (ref 0.0–2.0)
EOS%: 1.2 % (ref 0.0–7.0)
Eosinophils Absolute: 0.3 10*3/uL (ref 0.0–0.5)
HCT: 39 % (ref 34.8–46.6)
HEMOGLOBIN: 13 g/dL (ref 11.6–15.9)
LYMPH#: 18.2 10*3/uL — ABNORMAL HIGH (ref 0.9–3.3)
LYMPH%: 74.6 % — AB (ref 14.0–48.0)
MCH: 29.8 pg (ref 26.0–34.0)
MCHC: 33.3 g/dL (ref 32.0–36.0)
MCV: 89 fL (ref 81–101)
MONO#: 0.8 10*3/uL (ref 0.1–0.9)
MONO%: 3.3 % (ref 0.0–13.0)
NEUT%: 20.4 % — ABNORMAL LOW (ref 39.6–80.0)
NEUTROS ABS: 5 10*3/uL (ref 1.5–6.5)
PLATELETS: 263 10*3/uL (ref 145–400)
RBC: 4.36 10*6/uL (ref 3.70–5.32)
RDW: 13.3 % (ref 11.1–15.7)
WBC: 24.5 10*3/uL — AB (ref 3.9–10.0)

## 2016-03-05 LAB — TECHNOLOGIST REVIEW CHCC SATELLITE

## 2016-03-05 LAB — CHCC SATELLITE - SMEAR

## 2016-03-05 NOTE — Progress Notes (Signed)
Hematology and Oncology Follow Up Visit  Alicia Snow [id number] 1955-06-11 61 y.o. 03/05/2016   Principle Diagnosis:  Stage A CLL  Current Therapy:    Observation     Interim History:  Ms.  Alicia Snow is back for followup. She is doing quite well. She had her 61th birthday on Valentine's Day. She basically had a week of celebration. As such, her weight is up a little bit. She also had a "stomach virus" last week. She is getting over this. She had a lot of diarrhea with this.   She is a little bit sad because she lost her 60 year old who while walking. This has been a little bit difficult for her. She is looking for a rescue dog to a known.   Otherwise, she is working. She had a good Christmas and Thanksgiving. She is trying to exercise. She is trying to watch her weight although it has been tough with all the parties for her 61th birthday.   Her husband had a colonoscopy last week. She is not due for colonoscopy until 2022.   She already had her mammogram this year. Everything looked fine.  Overall, her performance status is ECOG 0.  Medications:  Current Outpatient Prescriptions:  .  aspirin 81 MG EC tablet, Take 81 mg by mouth daily. Swallow whole., Disp: , Rfl:  .  FISH OIL-KRILL OIL PO, Take 1 tablet by mouth every morning. , Disp: , Rfl:  .  FLUoxetine (PROZAC) 20 MG capsule, Take 20 mg by mouth daily., Disp: , Rfl:  .  meclizine (ANTIVERT) 25 MG tablet, Take 1 tablet (25 mg total) by mouth 3 (three) times daily as needed for dizziness., Disp: 30 tablet, Rfl: 0 .  Multiple Vitamins-Minerals (CENTRUM SILVER ULTRA WOMENS) TABS, Take 1 tablet by mouth daily. , Disp: , Rfl:  .  Probiotic Product (ALIGN) 4 MG CAPS, Take 1 capsule by mouth daily. , Disp: , Rfl:  .  VITAMIN D, ERGOCALCIFEROL, PO, Take 1 tablet by mouth daily. , Disp: , Rfl:   Allergies:  Allergies  Allergen Reactions  . Macrobid [Nitrofurantoin] Hives and Diarrhea    Past Medical History, Surgical history, Social  history, and Family History were reviewed and updated.  Review of Systems: As above  Physical Exam:  weight is 185 lb (83.9 kg). Her oral temperature is 98.3 F (36.8 C). Her blood pressure is 126/77 and her pulse is 75. Her respiration is 16 and oxygen saturation is 99%.   Well-developed and well-nourished white female in no obvious distress. Head and neck exam shows no ocular or oral lesions. She has no adenopathy in the neck. Lungs are clear. Cardiac exam regular rate and rhythm with no murmurs rubs or bruits. Abdomen is soft. Has good bowel sounds. She is no fluid wave. There is no palpable liver or spleen tip. Back exam no tenderness over the spine ribs or hips. Extremities shows no clubbing cyanosis or edema. Skin exam abrasions that are healing on her right knee and elbow. She has no ecchymoses.. Neurological exam is nonfocal.  Lab Results  Component Value Date   WBC 24.5 (H) 03/05/2016   HGB 13.0 03/05/2016   HCT 39.0 03/05/2016   MCV 89 03/05/2016   PLT 263 03/05/2016     Chemistry      Component Value Date/Time   NA 141 11/07/2015 1130   K 3.9 11/07/2015 1130   CL 103 08/01/2015 0932   CO2 22 11/07/2015 1130   BUN 23.9 11/07/2015 1130  CREATININE 0.9 11/07/2015 1130      Component Value Date/Time   CALCIUM 9.5 11/07/2015 1130   ALKPHOS 88 11/07/2015 1130   AST 19 11/07/2015 1130   ALT 21 11/07/2015 1130   BILITOT 0.68 11/07/2015 1130         Impression and Plan: [name] is a 61 year old white female with a CLL. We have been following her now for close to 10 years. I'm so glad that she had a wonderful 61th birthday. She certainly does not look 61.  She's done incredibly well. There is also no indication for therapy.  I think we can get her back now in 6 months.   Her faith remains very strong.     Volanda Napoleon, MD 2/26/20188:47 AM

## 2016-04-12 ENCOUNTER — Encounter: Payer: Self-pay | Admitting: Nurse Practitioner

## 2016-04-12 ENCOUNTER — Telehealth: Payer: Self-pay | Admitting: Gastroenterology

## 2016-04-12 ENCOUNTER — Ambulatory Visit (INDEPENDENT_AMBULATORY_CARE_PROVIDER_SITE_OTHER): Payer: BLUE CROSS/BLUE SHIELD | Admitting: Nurse Practitioner

## 2016-04-12 VITALS — BP 120/70 | HR 72 | Ht 62.0 in | Wt 191.4 lb

## 2016-04-12 DIAGNOSIS — K648 Other hemorrhoids: Secondary | ICD-10-CM | POA: Diagnosis not present

## 2016-04-12 MED ORDER — HYDROCORTISONE ACETATE 25 MG RE SUPP: 25.0000 mg | Freq: Two times a day (BID) | RECTAL | 0 refills | Status: DC

## 2016-04-12 NOTE — Patient Instructions (Signed)
If you are age 60 or older, your body mass index should be between 23-30. Your Body mass index is 35 kg/m. If this is out of the aforementioned range listed, please consider follow up with your Primary Care Provider.  If you are age 84 or younger, your body mass index should be between 19-25. Your Body mass index is 35 kg/m. If this is out of the aformentioned range listed, please consider follow up with your Primary Care Provider.   We have sent the following medications to your pharmacy for you to pick up at your convenience: Anusol  Use Glycerin suppositories as needed.  Use Recticare as needed for pain.  Thank you for choosing [facility]

## 2016-04-12 NOTE — Progress Notes (Signed)
     HPI: Patient is a 61 year old female followed for years by Dr. Olevia Perches for iIBS, now under care of Dr. Loletha Carrow. Patient come in for evaluation of anorectal pain. She gives a history of chronic hemorrhoid problems. Years ago she had hemorrhoid surgery and fissure repair. A few days ago she developed hemorrhoidal swelling and pain. No bleeding. Not using anything for the hemorrhoids. Stools are often hard to pass, even when soft so she does strain. Patient had a complete colonoscopy with a good prep in 2012. Random biopsies were negative.   Past Medical History:  Diagnosis Date  . Anal fissure   . Anxiety   . CLL (chronic lymphoblastic leukemia)   . Hemorrhoids   . Hyperlipidemia   . IBS (irritable bowel syndrome)   . Nephrolithiasis     Patient's surgical history, family medical history, social history, medications and allergies were all reviewed in Epic    Physical Exam: BP 120/70   Pulse 72   Ht 5\' 2"  (1.575 m)   Wt 191 lb 6 oz (86.8 kg)   LMP 01/08/2006   BMI 35.00 kg/m   GENERAL: pleasant white female in NAD PSYCH: :Pleasant, cooperative, normal affect EENT:  conjunctiva pink, mucous membranes moist, neck supple without masses PULM: Normal respiratory effort, lungs ABDOMEN:  soft, nontender, nondistended, no obvious masses, normal bowel sounds RECTAL: Swollen, tender external hemorrhoid, small protruding internal hemorrhoid  SKIN:  turgor, no lesions seen NEURO: Alert and oriented x 3, no focal neurologic deficits   ASSESSMENT and PLAN:  1. 61 yo female with anorectal pain. She gives a remote history of hemorrhoid surgery and anal fissure repair. On exam there were no obvious fissures. She has a swollen external hemorrhoid which I suspect is source of the pain. No fissures were seen. She also has a small protruding internal hemorrhoid. Will treat with a course of steroid suppositories. Long term she needs to avoid straing No obvious fissure but limited exam due to  swollen hemorrhoid.  -anusol supp bid x 7 days -recticare prn anal pain -Avoid straining.  Her stools are generally soft. Trial of glycerin supp as needed -return for follow up in 2-3 weeks.    2. CLL, no indication for therapy at present. Followed by Dr. Farris Has , NP 04/12/2016, 11:27 AM

## 2016-04-12 NOTE — Telephone Encounter (Signed)
Spoke to patient she has been having hemorrhoid inflammation x 3 days. She has tried OTC products with no relief. She denies bleeding, but is in a lot of pain. She was worked into [employer] for today.

## 2016-04-16 NOTE — Progress Notes (Signed)
Thank you for sending this case to me. I have reviewed the entire note, and the outlined plan seems appropriate.   Henry Danis, MD  

## 2016-04-30 ENCOUNTER — Ambulatory Visit: Payer: BLUE CROSS/BLUE SHIELD | Admitting: Nurse Practitioner

## 2016-07-31 DIAGNOSIS — H35363 Drusen (degenerative) of macula, bilateral: Secondary | ICD-10-CM | POA: Diagnosis not present

## 2016-07-31 DIAGNOSIS — H35361 Drusen (degenerative) of macula, right eye: Secondary | ICD-10-CM | POA: Diagnosis not present

## 2016-07-31 DIAGNOSIS — H04123 Dry eye syndrome of bilateral lacrimal glands: Secondary | ICD-10-CM | POA: Diagnosis not present

## 2016-07-31 DIAGNOSIS — H35362 Drusen (degenerative) of macula, left eye: Secondary | ICD-10-CM | POA: Diagnosis not present

## 2016-07-31 DIAGNOSIS — H43811 Vitreous degeneration, right eye: Secondary | ICD-10-CM | POA: Diagnosis not present

## 2016-07-31 DIAGNOSIS — H5319 Other subjective visual disturbances: Secondary | ICD-10-CM | POA: Diagnosis not present

## 2016-09-03 ENCOUNTER — Other Ambulatory Visit (HOSPITAL_BASED_OUTPATIENT_CLINIC_OR_DEPARTMENT_OTHER): Payer: BLUE CROSS/BLUE SHIELD

## 2016-09-03 ENCOUNTER — Ambulatory Visit (HOSPITAL_BASED_OUTPATIENT_CLINIC_OR_DEPARTMENT_OTHER): Payer: BLUE CROSS/BLUE SHIELD | Admitting: Hematology & Oncology

## 2016-09-03 VITALS — BP 135/74 | HR 70 | Temp 98.6°F | Resp 16 | Wt 190.0 lb

## 2016-09-03 DIAGNOSIS — B354 Tinea corporis: Secondary | ICD-10-CM

## 2016-09-03 DIAGNOSIS — C9110 Chronic lymphocytic leukemia of B-cell type not having achieved remission: Secondary | ICD-10-CM

## 2016-09-03 DIAGNOSIS — Z8579 Personal history of other malignant neoplasms of lymphoid, hematopoietic and related tissues: Secondary | ICD-10-CM | POA: Diagnosis not present

## 2016-09-03 LAB — CHCC SATELLITE - SMEAR

## 2016-09-03 LAB — CBC WITH DIFFERENTIAL (CANCER CENTER ONLY)
BASO#: 0 10*3/uL (ref 0.0–0.2)
BASO%: 0.2 % (ref 0.0–2.0)
EOS%: 0.8 % (ref 0.0–7.0)
Eosinophils Absolute: 0.2 10*3/uL (ref 0.0–0.5)
HEMATOCRIT: 40 % (ref 34.8–46.6)
HGB: 13.3 g/dL (ref 11.6–15.9)
LYMPH#: 14.4 10*3/uL — AB (ref 0.9–3.3)
LYMPH%: 75.5 % — ABNORMAL HIGH (ref 14.0–48.0)
MCH: 29.7 pg (ref 26.0–34.0)
MCHC: 33.3 g/dL (ref 32.0–36.0)
MCV: 89 fL (ref 81–101)
MONO#: 0.8 10*3/uL (ref 0.1–0.9)
MONO%: 4.2 % (ref 0.0–13.0)
NEUT#: 3.7 10*3/uL (ref 1.5–6.5)
NEUT%: 19.3 % — ABNORMAL LOW (ref 39.6–80.0)
Platelets: 218 10*3/uL (ref 145–400)
RBC: 4.48 10*6/uL (ref 3.70–5.32)
RDW: 13.1 % (ref 11.1–15.7)
WBC: 19 10*3/uL — ABNORMAL HIGH (ref 3.9–10.0)

## 2016-09-03 MED ORDER — CLOTRIMAZOLE 1 % EX CREA: 1.0000 "application " | TOPICAL_CREAM | Freq: Two times a day (BID) | CUTANEOUS | 0 refills | Status: DC

## 2016-09-03 NOTE — Progress Notes (Signed)
Hematology and Oncology Follow Up Visit  Alicia Snow [id number] 30-Jan-1955 61 y.o. 09/03/2016   Principle Diagnosis:  Stage A CLL  Current Therapy:    Observation     Interim History:  Alicia Snow is back for followup. She's had a very good summer. Last saw her back in February. She had a wonderful 61th birthday.  She's working. She's working quite hard.  She is exercising. She is trying to lose weight. She looks quite good. Her weight is about the same but she looks more tone.  She is worried about this skin lesion on the right side of her neck. I would to this. It looks like it might be tinea corporis.   She's not noted any fever. She's had no swollen lymph nodes. There's no change in bowel or bladder habits. She's had no cough. She's had no rashes, outside of that on the right side of her neck.   Overall, her performance status is ECOG 0.   Medications:  Current Outpatient Prescriptions:  .  aspirin 81 MG EC tablet, Take 81 mg by mouth daily. Swallow whole., Disp: , Rfl:  .  clotrimazole (LOTRIMIN) 1 % cream, Apply 1 application topically 2 (two) times daily., Disp: 30 g, Rfl: 0 .  FISH OIL-KRILL OIL PO, Take 1 tablet by mouth every morning. , Disp: , Rfl:  .  FLUoxetine (PROZAC) 20 MG capsule, Take 20 mg by mouth daily., Disp: , Rfl:  .  hydrocortisone (ANUSOL-HC) 25 MG suppository, Place 1 suppository (25 mg total) rectally 2 (two) times daily., Disp: 14 suppository, Rfl: 0 .  meclizine (ANTIVERT) 25 MG tablet, Take 1 tablet (25 mg total) by mouth 3 (three) times daily as needed for dizziness., Disp: 30 tablet, Rfl: 0 .  Multiple Vitamins-Minerals (CENTRUM SILVER ULTRA WOMENS) TABS, Take 1 tablet by mouth daily. , Disp: , Rfl:  .  Probiotic Product (ALIGN) 4 MG CAPS, Take 1 capsule by mouth daily. , Disp: , Rfl:  .  VITAMIN D, ERGOCALCIFEROL, PO, Take 1 tablet by mouth daily. , Disp: , Rfl:   Allergies:  Allergies  Allergen Reactions  . Macrobid [Nitrofurantoin] Hives and  Diarrhea    Past Medical History, Surgical history, Social history, and Family History were reviewed and updated.  Review of Systems: As stated in the interim history  Physical Exam:  weight is 190 lb (86.2 kg). Her oral temperature is 98.6 F (37 C). Her blood pressure is 135/74 and her pulse is 70. Her respiration is 16 and oxygen saturation is 96%.   Physical Exam  Constitutional: She is oriented to person, place, and time.  HENT:  Head: Normocephalic and atraumatic.  Mouth/Throat: Oropharynx is clear and moist.  Eyes: Pupils are equal, round, and reactive to light. EOM are normal.  Neck: Normal range of motion.  Cardiovascular: Normal rate, regular rhythm and normal heart sounds.   Pulmonary/Chest: Effort normal and breath sounds normal.  Abdominal: Soft. Bowel sounds are normal.  Musculoskeletal: Normal range of motion. She exhibits no edema, tenderness or deformity.  Lymphadenopathy:    She has no cervical adenopathy.  Neurological: She is alert and oriented to person, place, and time.  Skin: Skin is warm and dry. No rash noted. No erythema.  On the back of the right side of her neck, she does have a scaly slightly erythematous patch. It measures about 3 x 2 cm.  Psychiatric: She has a normal mood and affect. Her behavior is normal. Judgment and thought content  normal.  Vitals reviewed.    Lab Results  Component Value Date   WBC 19.0 (H) 09/03/2016   HGB 13.3 09/03/2016   HCT 40.0 09/03/2016   MCV 89 09/03/2016   PLT 218 09/03/2016     Chemistry      Component Value Date/Time   NA 141 11/07/2015 1130   K 3.9 11/07/2015 1130   CL 103 08/01/2015 0932   CO2 22 11/07/2015 1130   BUN 23.9 11/07/2015 1130   CREATININE 0.9 11/07/2015 1130      Component Value Date/Time   CALCIUM 9.5 11/07/2015 1130   ALKPHOS 88 11/07/2015 1130   AST 19 11/07/2015 1130   ALT 21 11/07/2015 1130   BILITOT 0.68 11/07/2015 1130         Impression and Plan: Alicia Snow is a  61 year old white female with a CLL. Everything looks wonderful. I do not see any issues with the CLL. We've been following her now for over 10 years.  She does have what I think is tinea corporis on the back of her neck on the right side. I will call in some Lotrimin cream for her.  If this does not work, then she go see her dermatologist.  We will plan to get her back in 6 months.  Volanda Napoleon, MD 8/27/201810:44 AM

## 2016-09-24 DIAGNOSIS — D234 Other benign neoplasm of skin of scalp and neck: Secondary | ICD-10-CM | POA: Diagnosis not present

## 2016-10-02 ENCOUNTER — Other Ambulatory Visit: Payer: Self-pay | Admitting: Obstetrics and Gynecology

## 2016-10-02 DIAGNOSIS — Z1231 Encounter for screening mammogram for malignant neoplasm of breast: Secondary | ICD-10-CM

## 2016-10-03 DIAGNOSIS — Z23 Encounter for immunization: Secondary | ICD-10-CM | POA: Diagnosis not present

## 2016-10-15 DIAGNOSIS — Z1231 Encounter for screening mammogram for malignant neoplasm of breast: Secondary | ICD-10-CM | POA: Diagnosis not present

## 2016-10-15 DIAGNOSIS — Z124 Encounter for screening for malignant neoplasm of cervix: Secondary | ICD-10-CM | POA: Diagnosis not present

## 2016-10-15 DIAGNOSIS — Z01419 Encounter for gynecological examination (general) (routine) without abnormal findings: Secondary | ICD-10-CM | POA: Diagnosis not present

## 2016-10-29 ENCOUNTER — Ambulatory Visit
Admission: RE | Admit: 2016-10-29 | Discharge: 2016-10-29 | Disposition: A | Discharge status: Home / Self Care | Payer: BLUE CROSS/BLUE SHIELD | Source: Ambulatory Visit | Attending: Obstetrics and Gynecology | Admitting: Obstetrics and Gynecology

## 2016-10-29 DIAGNOSIS — Z1231 Encounter for screening mammogram for malignant neoplasm of breast: Secondary | ICD-10-CM

## 2016-12-14 DIAGNOSIS — Z0000 Encounter for general adult medical examination without abnormal findings: Secondary | ICD-10-CM | POA: Diagnosis not present

## 2016-12-19 DIAGNOSIS — C9110 Chronic lymphocytic leukemia of B-cell type not having achieved remission: Secondary | ICD-10-CM | POA: Diagnosis not present

## 2016-12-19 DIAGNOSIS — E782 Mixed hyperlipidemia: Secondary | ICD-10-CM | POA: Diagnosis not present

## 2016-12-19 DIAGNOSIS — Z0000 Encounter for general adult medical examination without abnormal findings: Secondary | ICD-10-CM | POA: Diagnosis not present

## 2016-12-19 DIAGNOSIS — Z6836 Body mass index (BMI) 36.0-36.9, adult: Secondary | ICD-10-CM | POA: Diagnosis not present

## 2017-02-14 DIAGNOSIS — H04123 Dry eye syndrome of bilateral lacrimal glands: Secondary | ICD-10-CM | POA: Diagnosis not present

## 2017-02-14 DIAGNOSIS — H25013 Cortical age-related cataract, bilateral: Secondary | ICD-10-CM | POA: Diagnosis not present

## 2017-02-14 DIAGNOSIS — H02833 Dermatochalasis of right eye, unspecified eyelid: Secondary | ICD-10-CM | POA: Diagnosis not present

## 2017-02-14 DIAGNOSIS — H2513 Age-related nuclear cataract, bilateral: Secondary | ICD-10-CM | POA: Diagnosis not present

## 2017-02-14 DIAGNOSIS — H35363 Drusen (degenerative) of macula, bilateral: Secondary | ICD-10-CM | POA: Diagnosis not present

## 2017-03-06 ENCOUNTER — Other Ambulatory Visit: Payer: Self-pay

## 2017-03-06 ENCOUNTER — Inpatient Hospital Stay (HOSPITAL_BASED_OUTPATIENT_CLINIC_OR_DEPARTMENT_OTHER): Payer: BLUE CROSS/BLUE SHIELD | Admitting: Hematology & Oncology

## 2017-03-06 ENCOUNTER — Inpatient Hospital Stay: Payer: BLUE CROSS/BLUE SHIELD | Attending: Hematology & Oncology

## 2017-03-06 VITALS — BP 133/77 | HR 74 | Temp 98.7°F | Resp 20 | Wt 195.8 lb

## 2017-03-06 DIAGNOSIS — C9110 Chronic lymphocytic leukemia of B-cell type not having achieved remission: Secondary | ICD-10-CM | POA: Insufficient documentation

## 2017-03-06 DIAGNOSIS — Z79899 Other long term (current) drug therapy: Secondary | ICD-10-CM | POA: Diagnosis not present

## 2017-03-06 DIAGNOSIS — Z7982 Long term (current) use of aspirin: Secondary | ICD-10-CM

## 2017-03-06 LAB — CMP (CANCER CENTER ONLY)
ALT: 29 U/L (ref 0–55)
ANION GAP: 13 — AB (ref 3–11)
AST: 26 U/L (ref 5–34)
Albumin: 4.6 g/dL (ref 3.5–5.0)
Alkaline Phosphatase: 92 U/L (ref 40–150)
BUN: 18 mg/dL (ref 7–26)
CHLORIDE: 104 mmol/L (ref 98–109)
CO2: 26 mmol/L (ref 22–29)
Calcium: 10.5 mg/dL — ABNORMAL HIGH (ref 8.4–10.4)
Creatinine: 1.04 mg/dL (ref 0.60–1.10)
GFR, EST NON AFRICAN AMERICAN: 57 mL/min — AB (ref >60)
Glucose, Bld: 103 mg/dL (ref 70–140)
POTASSIUM: 4.5 mmol/L (ref 3.5–5.1)
SODIUM: 143 mmol/L (ref 136–145)
Total Bilirubin: 0.7 mg/dL (ref 0.2–1.2)
Total Protein: 7.5 g/dL (ref 6.4–8.3)

## 2017-03-06 LAB — CBC WITH DIFFERENTIAL (CANCER CENTER ONLY)
Basophils Absolute: 0 10*3/uL (ref 0.0–0.1)
Basophils Relative: 0 %
EOS ABS: 0.2 10*3/uL (ref 0.0–0.5)
EOS PCT: 1 %
HCT: 41.2 % (ref 34.8–46.6)
Hemoglobin: 13.4 g/dL (ref 11.6–15.9)
LYMPHS ABS: 18.4 10*3/uL — AB (ref 0.9–3.3)
LYMPHS PCT: 74 %
MCH: 29.5 pg (ref 26.0–34.0)
MCHC: 32.5 g/dL (ref 32.0–36.0)
MCV: 90.5 fL (ref 81.0–101.0)
MONO ABS: 0.9 10*3/uL (ref 0.1–0.9)
Monocytes Relative: 4 %
Neutro Abs: 5.2 10*3/uL (ref 1.5–6.5)
Neutrophils Relative %: 21 %
PLATELETS: 246 10*3/uL (ref 145–400)
RBC: 4.55 MIL/uL (ref 3.70–5.32)
RDW: 13.2 % (ref 11.1–15.7)
WBC Count: 24.7 10*3/uL — ABNORMAL HIGH (ref 3.9–10.0)

## 2017-03-06 NOTE — Progress Notes (Signed)
Hematology and Oncology Follow Up Visit  Alicia Snow [id number] January 23, 1955 62 y.o. 03/06/2017   Principle Diagnosis:  Stage A CLL  Current Therapy:    Observation     Interim History:  Ms.  Snow is back for followup.  Unfortunately, she gained weight.  Her weight is up to 195 pounds.  She is really not been able to walk because of all the rain that we have had since we last saw her 6 months ago.  She has had no issues with nausea or vomiting.  She is eating well.  She is had no fever.    She is still working.  She is a Radio broadcast assistant.  She is noted a tiny nodule on the right side of her neck.  This is nontender and mobile.  She has had no rashes.  She is had no leg swelling.  There is been no cough.  She is so far avoided the flu.  Overall, her performance status is ECOG 0.   Medications:  Current Outpatient Medications:  .  aspirin 81 MG EC tablet, Take 81 mg by mouth daily. Swallow whole., Disp: , Rfl:  .  co-enzyme Q-10 30 MG capsule, Take 30 mg by mouth daily., Disp: , Rfl:  .  FISH OIL-KRILL OIL PO, Take 1 tablet by mouth every morning. , Disp: , Rfl:  .  FLUoxetine (PROZAC) 20 MG capsule, Take 20 mg by mouth daily., Disp: , Rfl:  .  meclizine (ANTIVERT) 25 MG tablet, Take 1 tablet (25 mg total) by mouth 3 (three) times daily as needed for dizziness., Disp: 30 tablet, Rfl: 0 .  Multiple Vitamins-Minerals (CENTRUM SILVER ULTRA WOMENS) TABS, Take 1 tablet by mouth daily. , Disp: , Rfl:  .  Probiotic Product (ALIGN) 4 MG CAPS, Take 1 capsule by mouth daily. , Disp: , Rfl:  .  rosuvastatin (CRESTOR) 5 MG tablet, Take 5 mg by mouth at bedtime., Disp: , Rfl:  .  VITAMIN D, ERGOCALCIFEROL, PO, Take 1 tablet by mouth daily. , Disp: , Rfl:   Allergies:  Allergies  Allergen Reactions  . Macrobid [Nitrofurantoin] Hives and Diarrhea    Past Medical History, Surgical history, Social history, and Family History were reviewed and updated.  Review of Systems: Review of Systems   Constitutional: Negative.   HENT: Negative.   Eyes: Negative.   Respiratory: Negative.   Cardiovascular: Negative.   Gastrointestinal: Negative.   Genitourinary: Negative.   Musculoskeletal: Negative.   Skin: Negative.   Neurological: Negative.   Endo/Heme/Allergies: Negative.   Psychiatric/Behavioral: Negative.     Physical Exam:  weight is 195 lb 12 oz (88.8 kg). Her oral temperature is 98.7 F (37.1 C). Her blood pressure is 133/77 and her pulse is 74. Her respiration is 20 and oxygen saturation is 98%.   Physical Exam  Constitutional: She is oriented to person, place, and time.  HENT:  Head: Normocephalic and atraumatic.  Mouth/Throat: Oropharynx is clear and moist.  Eyes: EOM are normal. Pupils are equal, round, and reactive to light.  Neck: Normal range of motion.  Cardiovascular: Normal rate, regular rhythm and normal heart sounds.  Pulmonary/Chest: Effort normal and breath sounds normal.  Abdominal: Soft. Bowel sounds are normal.  Musculoskeletal: Normal range of motion. She exhibits no edema, tenderness or deformity.  Lymphadenopathy:    She has no cervical adenopathy.  Neurological: She is alert and oriented to person, place, and time.  Skin: Skin is warm and dry. No rash noted. No erythema.  On the back of the right side of her neck, she does have a scaly slightly erythematous patch. It measures about 3 x 2 cm.  Psychiatric: She has a normal mood and affect. Her behavior is normal. Judgment and thought content normal.  Vitals reviewed.    Lab Results  Component Value Date   WBC 24.7 (H) 03/06/2017   HGB 13.3 09/03/2016   HCT 41.2 03/06/2017   MCV 90.5 03/06/2017   PLT 246 03/06/2017     Chemistry      Component Value Date/Time   NA 141 11/07/2015 1130   K 3.9 11/07/2015 1130   CL 103 08/01/2015 0932   CO2 22 11/07/2015 1130   BUN 23.9 11/07/2015 1130   CREATININE 0.9 11/07/2015 1130      Component Value Date/Time   CALCIUM 9.5 11/07/2015 1130    ALKPHOS 88 11/07/2015 1130   AST 19 11/07/2015 1130   ALT 21 11/07/2015 1130   BILITOT 0.68 11/07/2015 1130         Impression and Plan: Alicia Snow is a 62 year old white female with a CLL. Everything looks wonderful. I do not see any issues with the CLL. We've been following her now for over 10 years.  For right now, we will continue to follow her along.  I think her weight is more of an issue than the CLL.  Am not sure if this little nodule is a lymph node.  If it is a lymph node, we will no this as it probably will grow after we see her in 6 months.  I told her to exercise more.  She really has to exercise.  Volanda Napoleon, MD 2/27/201910:03 AM

## 2017-03-21 DIAGNOSIS — Z23 Encounter for immunization: Secondary | ICD-10-CM | POA: Diagnosis not present

## 2017-03-21 DIAGNOSIS — E782 Mixed hyperlipidemia: Secondary | ICD-10-CM | POA: Diagnosis not present

## 2017-03-21 DIAGNOSIS — F411 Generalized anxiety disorder: Secondary | ICD-10-CM | POA: Diagnosis not present

## 2017-03-21 DIAGNOSIS — Z1159 Encounter for screening for other viral diseases: Secondary | ICD-10-CM | POA: Diagnosis not present

## 2017-03-21 DIAGNOSIS — E6609 Other obesity due to excess calories: Secondary | ICD-10-CM | POA: Diagnosis not present

## 2017-05-31 ENCOUNTER — Other Ambulatory Visit: Payer: Self-pay

## 2017-05-31 ENCOUNTER — Telehealth: Payer: Self-pay | Admitting: Gastroenterology

## 2017-05-31 NOTE — Telephone Encounter (Signed)
Last seen 04-2016. Refill request for Anusol

## 2017-06-04 MED ORDER — HYDROCORTISONE ACETATE 25 MG RE SUPP: 25.0000 mg | Freq: Two times a day (BID) | RECTAL | 1 refills | Status: DC

## 2017-06-04 NOTE — Telephone Encounter (Signed)
Please refill, and arrange an office visit with me within next 2 months.  - HD

## 2017-06-04 NOTE — Telephone Encounter (Signed)
Left a message to return call.  

## 2017-06-05 NOTE — Telephone Encounter (Signed)
07-17-2017 Follow up scheduled

## 2017-06-25 DIAGNOSIS — E782 Mixed hyperlipidemia: Secondary | ICD-10-CM | POA: Diagnosis not present

## 2017-06-25 DIAGNOSIS — F411 Generalized anxiety disorder: Secondary | ICD-10-CM | POA: Diagnosis not present

## 2017-06-25 DIAGNOSIS — C9110 Chronic lymphocytic leukemia of B-cell type not having achieved remission: Secondary | ICD-10-CM | POA: Diagnosis not present

## 2017-07-17 ENCOUNTER — Ambulatory Visit: Payer: BLUE CROSS/BLUE SHIELD | Admitting: Gastroenterology

## 2017-09-02 ENCOUNTER — Encounter: Payer: Self-pay | Admitting: Hematology & Oncology

## 2017-09-02 ENCOUNTER — Inpatient Hospital Stay: Payer: BLUE CROSS/BLUE SHIELD | Attending: Hematology & Oncology | Admitting: Hematology & Oncology

## 2017-09-02 ENCOUNTER — Inpatient Hospital Stay: Payer: BLUE CROSS/BLUE SHIELD

## 2017-09-02 ENCOUNTER — Other Ambulatory Visit: Payer: Self-pay

## 2017-09-02 VITALS — BP 129/72 | HR 70 | Temp 98.6°F | Resp 18 | Wt 191.0 lb

## 2017-09-02 DIAGNOSIS — C9110 Chronic lymphocytic leukemia of B-cell type not having achieved remission: Secondary | ICD-10-CM

## 2017-09-02 LAB — CBC WITH DIFFERENTIAL (CANCER CENTER ONLY)
BASOS PCT: 0 %
Basophils Absolute: 0 10*3/uL (ref 0.0–0.1)
Eosinophils Absolute: 0.2 10*3/uL (ref 0.0–0.5)
Eosinophils Relative: 1 %
HEMATOCRIT: 40.3 % (ref 34.8–46.6)
HEMOGLOBIN: 12.9 g/dL (ref 11.6–15.9)
LYMPHS PCT: 71 %
Lymphs Abs: 14.5 10*3/uL — ABNORMAL HIGH (ref 0.9–3.3)
MCH: 29 pg (ref 26.0–34.0)
MCHC: 32 g/dL (ref 32.0–36.0)
MCV: 90.6 fL (ref 81.0–101.0)
MONOS PCT: 4 %
Monocytes Absolute: 0.8 10*3/uL (ref 0.1–0.9)
NEUTROS ABS: 4.8 10*3/uL (ref 1.5–6.5)
NEUTROS PCT: 24 %
Platelet Count: 229 10*3/uL (ref 145–400)
RBC: 4.45 MIL/uL (ref 3.70–5.32)
RDW: 13.1 % (ref 11.1–15.7)
WBC Count: 20.4 10*3/uL — ABNORMAL HIGH (ref 3.9–10.0)

## 2017-09-02 LAB — CMP (CANCER CENTER ONLY)
ALT: 26 U/L (ref 10–47)
ANION GAP: 11 (ref 5–15)
AST: 25 U/L (ref 11–38)
Albumin: 4.2 g/dL (ref 3.5–5.0)
Alkaline Phosphatase: 76 U/L (ref 26–84)
BILIRUBIN TOTAL: 0.8 mg/dL (ref 0.2–1.6)
BUN: 18 mg/dL (ref 7–22)
CALCIUM: 9.4 mg/dL (ref 8.0–10.3)
CO2: 28 mmol/L (ref 18–33)
Chloride: 102 mmol/L (ref 98–108)
Creatinine: 1 mg/dL (ref 0.60–1.20)
Glucose, Bld: 99 mg/dL (ref 73–118)
POTASSIUM: 4 mmol/L (ref 3.3–4.7)
Sodium: 141 mmol/L (ref 128–145)
Total Protein: 7.1 g/dL (ref 6.4–8.1)

## 2017-09-02 LAB — LACTATE DEHYDROGENASE: LDH: 181 U/L (ref 98–192)

## 2017-09-02 LAB — TECHNOLOGIST SMEAR REVIEW

## 2017-09-02 NOTE — Progress Notes (Signed)
Hematology and Oncology Follow Up Visit  Alicia Snow [id number] 1955-10-31 62 y.o. 09/02/2017   Principle Diagnosis:  Stage A CLL  Current Therapy:    Observation     Interim History:  Ms.  Alicia Snow is back for followup.  She actually looks quite good.  She is lost 4 pounds since we last saw her.  As always, she is working like crazy.  She does not take any vacations.  Her husband is doing okay.  He has had no problems himself.  She did buy a new car last week.  She showed me a picture of it.  She is very excited about this.  She has had no fever.  She is had no change in bowel or bladder habits.  She has had no swollen lymph nodes.  There is been no neck pain.  Her appetite is doing okay.  Overall, her performance status is ECOG 0.   Medications:  Current Outpatient Medications:  .  co-enzyme Q-10 30 MG capsule, Take 30 mg by mouth daily., Disp: , Rfl:  .  FISH OIL-KRILL OIL PO, Take 1 tablet by mouth every morning. , Disp: , Rfl:  .  FLUoxetine (PROZAC) 20 MG capsule, Take 20 mg by mouth daily., Disp: , Rfl:  .  hydrocortisone (ANUSOL-HC) 25 MG suppository, Place 1 suppository (25 mg total) rectally every 12 (twelve) hours., Disp: 12 suppository, Rfl: 1 .  meclizine (ANTIVERT) 25 MG tablet, Take 1 tablet (25 mg total) by mouth 3 (three) times daily as needed for dizziness., Disp: 30 tablet, Rfl: 0 .  Multiple Vitamins-Minerals (CENTRUM SILVER ULTRA WOMENS) TABS, Take 1 tablet by mouth daily. , Disp: , Rfl:  .  Probiotic Product (ALIGN) 4 MG CAPS, Take 1 capsule by mouth daily. , Disp: , Rfl:  .  rosuvastatin (CRESTOR) 5 MG tablet, Take 5 mg by mouth at bedtime., Disp: , Rfl:  .  VITAMIN D, ERGOCALCIFEROL, PO, Take 1 tablet by mouth daily. , Disp: , Rfl:   Allergies:  Allergies  Allergen Reactions  . Nitrofurantoin Hives, Diarrhea and Rash    Past Medical History, Surgical history, Social history, and Family History were reviewed and updated.  Review of Systems: Review of  Systems  Constitutional: Negative.   HENT: Negative.   Eyes: Negative.   Respiratory: Negative.   Cardiovascular: Negative.   Gastrointestinal: Negative.   Genitourinary: Negative.   Musculoskeletal: Negative.   Skin: Negative.   Neurological: Negative.   Endo/Heme/Allergies: Negative.   Psychiatric/Behavioral: Negative.     Physical Exam:  weight is 191 lb (86.6 kg). Her oral temperature is 98.6 F (37 C). Her blood pressure is 129/72 and her pulse is 70. Her respiration is 18 and oxygen saturation is 100%.   Physical Exam  Constitutional: She is oriented to person, place, and time.  HENT:  Head: Normocephalic and atraumatic.  Mouth/Throat: Oropharynx is clear and moist.  Eyes: Pupils are equal, round, and reactive to light. EOM are normal.  Neck: Normal range of motion.  Cardiovascular: Normal rate, regular rhythm and normal heart sounds.  Pulmonary/Chest: Effort normal and breath sounds normal.  Abdominal: Soft. Bowel sounds are normal.  Musculoskeletal: Normal range of motion. She exhibits no edema, tenderness or deformity.  Lymphadenopathy:    She has no cervical adenopathy.  Neurological: She is alert and oriented to person, place, and time.  Skin: Skin is warm and dry. No rash noted. No erythema.  On the back of the right side of her neck,  she does have a scaly slightly erythematous patch. It measures about 3 x 2 cm.  Psychiatric: She has a normal mood and affect. Her behavior is normal. Judgment and thought content normal.  Vitals reviewed.    Lab Results  Component Value Date   WBC 20.4 (H) 09/02/2017   HGB 12.9 09/02/2017   HCT 40.3 09/02/2017   MCV 90.6 09/02/2017   PLT 229 09/02/2017     Chemistry      Component Value Date/Time   NA 143 03/06/2017 0908   NA 141 11/07/2015 1130   K 4.5 03/06/2017 0908   K 3.9 11/07/2015 1130   CL 104 03/06/2017 0908   CL 103 08/01/2015 0932   CO2 26 03/06/2017 0908   CO2 22 11/07/2015 1130   BUN 18 03/06/2017 0908    BUN 23.9 11/07/2015 1130   CREATININE 1.04 03/06/2017 0908   CREATININE 0.9 11/07/2015 1130      Component Value Date/Time   CALCIUM 10.5 (H) 03/06/2017 0908   CALCIUM 9.5 11/07/2015 1130   ALKPHOS 92 03/06/2017 0908   ALKPHOS 88 11/07/2015 1130   AST 26 03/06/2017 0908   AST 19 11/07/2015 1130   ALT 29 03/06/2017 0908   ALT 21 11/07/2015 1130   BILITOT 0.7 03/06/2017 0908   BILITOT 0.68 11/07/2015 1130         Impression and Plan: [name] is a 62 year old white female with a CLL. Everything looks wonderful. I do not see any issues with the CLL. We've been following her now for 11 years.  For right now, we will continue to follow her along.  I think her weight is more of an issue than the CLL.  Hopefully, she will continue to lose some weight.  I think this will be her bigger problem going forward versus the CLL.  I just do not see any problems with the CLL that would indicate we have to treat her.  We will see her back in 6 months.  Volanda Napoleon, MD 8/26/20199:43 AM

## 2017-09-23 ENCOUNTER — Other Ambulatory Visit: Payer: Self-pay | Admitting: Obstetrics and Gynecology

## 2017-09-23 DIAGNOSIS — Z1231 Encounter for screening mammogram for malignant neoplasm of breast: Secondary | ICD-10-CM

## 2017-09-25 DIAGNOSIS — Z23 Encounter for immunization: Secondary | ICD-10-CM | POA: Diagnosis not present

## 2017-10-30 ENCOUNTER — Ambulatory Visit
Admission: RE | Admit: 2017-10-30 | Discharge: 2017-10-30 | Disposition: A | Discharge status: Home / Self Care | Payer: BLUE CROSS/BLUE SHIELD | Source: Ambulatory Visit | Attending: Obstetrics and Gynecology | Admitting: Obstetrics and Gynecology

## 2017-10-30 DIAGNOSIS — Z1231 Encounter for screening mammogram for malignant neoplasm of breast: Secondary | ICD-10-CM | POA: Diagnosis not present

## 2017-11-07 DIAGNOSIS — Z01419 Encounter for gynecological examination (general) (routine) without abnormal findings: Secondary | ICD-10-CM | POA: Diagnosis not present

## 2017-11-07 DIAGNOSIS — Z1231 Encounter for screening mammogram for malignant neoplasm of breast: Secondary | ICD-10-CM | POA: Diagnosis not present

## 2017-12-20 DIAGNOSIS — E782 Mixed hyperlipidemia: Secondary | ICD-10-CM | POA: Diagnosis not present

## 2017-12-20 DIAGNOSIS — Z0000 Encounter for general adult medical examination without abnormal findings: Secondary | ICD-10-CM | POA: Diagnosis not present

## 2017-12-20 DIAGNOSIS — C9110 Chronic lymphocytic leukemia of B-cell type not having achieved remission: Secondary | ICD-10-CM | POA: Diagnosis not present

## 2017-12-20 DIAGNOSIS — Z6836 Body mass index (BMI) 36.0-36.9, adult: Secondary | ICD-10-CM | POA: Diagnosis not present

## 2018-01-28 DIAGNOSIS — H43812 Vitreous degeneration, left eye: Secondary | ICD-10-CM | POA: Diagnosis not present

## 2018-01-28 DIAGNOSIS — H43392 Other vitreous opacities, left eye: Secondary | ICD-10-CM | POA: Diagnosis not present

## 2018-01-28 DIAGNOSIS — H5319 Other subjective visual disturbances: Secondary | ICD-10-CM | POA: Diagnosis not present

## 2018-02-20 DIAGNOSIS — H3509 Other intraretinal microvascular abnormalities: Secondary | ICD-10-CM | POA: Diagnosis not present

## 2018-02-20 DIAGNOSIS — H2513 Age-related nuclear cataract, bilateral: Secondary | ICD-10-CM | POA: Diagnosis not present

## 2018-02-20 DIAGNOSIS — H25013 Cortical age-related cataract, bilateral: Secondary | ICD-10-CM | POA: Diagnosis not present

## 2018-02-20 DIAGNOSIS — H04123 Dry eye syndrome of bilateral lacrimal glands: Secondary | ICD-10-CM | POA: Diagnosis not present

## 2018-02-20 DIAGNOSIS — H35363 Drusen (degenerative) of macula, bilateral: Secondary | ICD-10-CM | POA: Diagnosis not present

## 2018-03-03 ENCOUNTER — Inpatient Hospital Stay: Payer: BLUE CROSS/BLUE SHIELD

## 2018-03-03 ENCOUNTER — Other Ambulatory Visit: Payer: Self-pay

## 2018-03-03 ENCOUNTER — Encounter: Payer: Self-pay | Admitting: Hematology & Oncology

## 2018-03-03 ENCOUNTER — Inpatient Hospital Stay: Payer: BLUE CROSS/BLUE SHIELD | Attending: Hematology & Oncology | Admitting: Hematology & Oncology

## 2018-03-03 VITALS — BP 131/63 | HR 86 | Temp 98.2°F | Resp 18 | Wt 193.5 lb

## 2018-03-03 DIAGNOSIS — C9110 Chronic lymphocytic leukemia of B-cell type not having achieved remission: Secondary | ICD-10-CM

## 2018-03-03 LAB — CBC WITH DIFFERENTIAL (CANCER CENTER ONLY)
ABS IMMATURE GRANULOCYTES: 0.05 10*3/uL (ref 0.00–0.07)
BASOS ABS: 0.1 10*3/uL (ref 0.0–0.1)
Basophils Relative: 1 %
Eosinophils Absolute: 0.2 10*3/uL (ref 0.0–0.5)
Eosinophils Relative: 1 %
HCT: 41.6 % (ref 36.0–46.0)
HEMOGLOBIN: 13.4 g/dL (ref 12.0–15.0)
IMMATURE GRANULOCYTES: 0 %
LYMPHS PCT: 70 %
Lymphs Abs: 15 10*3/uL — ABNORMAL HIGH (ref 0.7–4.0)
MCH: 29.5 pg (ref 26.0–34.0)
MCHC: 32.2 g/dL (ref 30.0–36.0)
MCV: 91.6 fL (ref 80.0–100.0)
Monocytes Absolute: 0.8 10*3/uL (ref 0.1–1.0)
Monocytes Relative: 4 %
NEUTROS PCT: 24 %
NRBC: 0 % (ref 0.0–0.2)
Neutro Abs: 5.2 10*3/uL (ref 1.7–7.7)
PLATELETS: 265 10*3/uL (ref 150–400)
RBC: 4.54 MIL/uL (ref 3.87–5.11)
RDW: 12.7 % (ref 11.5–15.5)
WBC: 21.3 10*3/uL — AB (ref 4.0–10.5)

## 2018-03-03 LAB — CMP (CANCER CENTER ONLY)
ALT: 22 U/L (ref 0–44)
AST: 21 U/L (ref 15–41)
Albumin: 5.2 g/dL — ABNORMAL HIGH (ref 3.5–5.0)
Alkaline Phosphatase: 74 U/L (ref 38–126)
Anion gap: 12 (ref 5–15)
BUN: 21 mg/dL (ref 8–23)
CHLORIDE: 102 mmol/L (ref 98–111)
CO2: 27 mmol/L (ref 22–32)
Calcium: 10.3 mg/dL (ref 8.9–10.3)
Creatinine: 1.08 mg/dL — ABNORMAL HIGH (ref 0.44–1.00)
GFR, EST NON AFRICAN AMERICAN: 55 mL/min — AB (ref >60)
Glucose, Bld: 113 mg/dL — ABNORMAL HIGH (ref 70–99)
Potassium: 4.8 mmol/L (ref 3.5–5.1)
Sodium: 141 mmol/L (ref 135–145)
Total Bilirubin: 0.6 mg/dL (ref 0.3–1.2)
Total Protein: 6.8 g/dL (ref 6.5–8.1)

## 2018-03-03 LAB — SAVE SMEAR (SSMR)

## 2018-03-03 NOTE — Progress Notes (Signed)
Hematology and Oncology Follow Up Visit  Alicia Snow [id number] 1955-05-18 63 y.o. 03/03/2018   Principle Diagnosis:  Stage A CLL  Current Therapy:    Observation     Interim History:  Ms.  Snow is back for followup.  We see her every 6 months.  She is still working.  She has not lost any weight.  She now has a Herbalist.  Hopefully, she will burn a few more calories with the stand-up desk.  She has had no problems with the holidays.  Her husband is doing well.  She is exercising.  She has 3 dogs that she tries to walk.  She has had no infections.  There is been no rashes.  She has had no change in bowel or bladder habits.  Her last mammogram was back in October and everything looked okay.  She has had no cough.  There is no shortness of breath.  She is not noted any swollen lymph nodes.  Overall, her performance status is ECOG 0.   Medications:  Current Outpatient Medications:  .  co-enzyme Q-10 30 MG capsule, Take 30 mg by mouth daily., Disp: , Rfl:  .  FISH OIL-KRILL OIL PO, Take 1 tablet by mouth every morning. , Disp: , Rfl:  .  FLUoxetine (PROZAC) 20 MG capsule, Take 20 mg by mouth daily., Disp: , Rfl:  .  Multiple Vitamins-Minerals (CENTRUM SILVER ULTRA WOMENS) TABS, Take 1 tablet by mouth daily. , Disp: , Rfl:  .  Probiotic Product (ALIGN) 4 MG CAPS, Take 1 capsule by mouth daily. , Disp: , Rfl:  .  rosuvastatin (CRESTOR) 5 MG tablet, Take 5 mg by mouth at bedtime., Disp: , Rfl:  .  VITAMIN D, ERGOCALCIFEROL, PO, Take 1 tablet by mouth daily. , Disp: , Rfl:   Allergies:  Allergies  Allergen Reactions  . Nitrofurantoin Hives, Diarrhea and Rash    Past Medical History, Surgical history, Social history, and Family History were reviewed and updated.  Review of Systems: Review of Systems  Constitutional: Negative.   HENT: Negative.   Eyes: Negative.   Respiratory: Negative.   Cardiovascular: Negative.   Gastrointestinal: Negative.   Genitourinary:  Negative.   Musculoskeletal: Negative.   Skin: Negative.   Neurological: Negative.   Endo/Heme/Allergies: Negative.   Psychiatric/Behavioral: Negative.     Physical Exam:  weight is 193 lb 8 oz (87.8 kg). Her oral temperature is 98.2 F (36.8 C). Her blood pressure is 131/63 and her pulse is 86. Her respiration is 18 and oxygen saturation is 100%.   Physical Exam Vitals signs reviewed.  HENT:     Head: Normocephalic and atraumatic.  Eyes:     Pupils: Pupils are equal, round, and reactive to light.  Neck:     Musculoskeletal: Normal range of motion.  Cardiovascular:     Rate and Rhythm: Normal rate and regular rhythm.     Heart sounds: Normal heart sounds.  Pulmonary:     Effort: Pulmonary effort is normal.     Breath sounds: Normal breath sounds.  Abdominal:     General: Bowel sounds are normal.     Palpations: Abdomen is soft.  Musculoskeletal: Normal range of motion.        General: No tenderness or deformity.  Lymphadenopathy:     Cervical: No cervical adenopathy.  Skin:    General: Skin is warm and dry.     Findings: No erythema or rash.     Comments: On the back  of the right side of her neck, she does have a scaly slightly erythematous patch. It measures about 3 x 2 cm.  Neurological:     Mental Status: She is alert and oriented to person, place, and time.  Psychiatric:        Behavior: Behavior normal.        Thought Content: Thought content normal.        Judgment: Judgment normal.      Lab Results  Component Value Date   WBC 21.3 (H) 03/03/2018   HGB 13.4 03/03/2018   HCT 41.6 03/03/2018   MCV 91.6 03/03/2018   PLT 265 03/03/2018     Chemistry      Component Value Date/Time   NA 141 03/03/2018 0920   NA 141 11/07/2015 1130   K 4.8 03/03/2018 0920   K 3.9 11/07/2015 1130   CL 102 03/03/2018 0920   CL 103 08/01/2015 0932   CO2 27 03/03/2018 0920   CO2 22 11/07/2015 1130   BUN 21 03/03/2018 0920   BUN 23.9 11/07/2015 1130   CREATININE 1.08 (H)  03/03/2018 0920   CREATININE 0.9 11/07/2015 1130      Component Value Date/Time   CALCIUM 10.3 03/03/2018 0920   CALCIUM 9.5 11/07/2015 1130   ALKPHOS 74 03/03/2018 0920   ALKPHOS 88 11/07/2015 1130   AST 21 03/03/2018 0920   AST 19 11/07/2015 1130   ALT 22 03/03/2018 0920   ALT 21 11/07/2015 1130   BILITOT 0.6 03/03/2018 0920   BILITOT 0.68 11/07/2015 1130         Impression and Plan: Alicia Snow is a 63 year old white female with a CLL. Everything looks wonderful. I do not see any issues with the CLL. We've been following her now for 11 years.  For right now, we will continue to follow her along.  I think her weight is more of an issue than the CLL.  Hopefully, she will continue to lose some weight.  I think this will be her bigger problem going forward versus the CLL.  I just do not see any problems with the CLL that would indicate we have to treat her.  We will see her back in 6 months.  Volanda Napoleon, MD 2/24/202010:25 AM

## 2018-05-01 DIAGNOSIS — N3000 Acute cystitis without hematuria: Secondary | ICD-10-CM | POA: Diagnosis not present

## 2018-09-01 ENCOUNTER — Inpatient Hospital Stay: Payer: BC Managed Care – PPO

## 2018-09-01 ENCOUNTER — Encounter: Payer: Self-pay | Admitting: Hematology & Oncology

## 2018-09-01 ENCOUNTER — Inpatient Hospital Stay: Payer: BC Managed Care – PPO | Attending: Hematology & Oncology | Admitting: Hematology & Oncology

## 2018-09-01 ENCOUNTER — Other Ambulatory Visit: Payer: Self-pay

## 2018-09-01 VITALS — BP 132/69 | HR 70 | Temp 97.8°F | Resp 20 | Wt 192.0 lb

## 2018-09-01 DIAGNOSIS — L539 Erythematous condition, unspecified: Secondary | ICD-10-CM | POA: Diagnosis not present

## 2018-09-01 DIAGNOSIS — C9110 Chronic lymphocytic leukemia of B-cell type not having achieved remission: Secondary | ICD-10-CM

## 2018-09-01 DIAGNOSIS — Z79899 Other long term (current) drug therapy: Secondary | ICD-10-CM | POA: Insufficient documentation

## 2018-09-01 DIAGNOSIS — Z881 Allergy status to other antibiotic agents status: Secondary | ICD-10-CM | POA: Diagnosis not present

## 2018-09-01 LAB — CMP (CANCER CENTER ONLY)
ALT: 28 U/L (ref 0–44)
AST: 24 U/L (ref 15–41)
Albumin: 4.5 g/dL (ref 3.5–5.0)
Alkaline Phosphatase: 71 U/L (ref 38–126)
Anion gap: 11 (ref 5–15)
BUN: 21 mg/dL (ref 8–23)
CO2: 27 mmol/L (ref 22–32)
Calcium: 9.4 mg/dL (ref 8.9–10.3)
Chloride: 102 mmol/L (ref 98–111)
Creatinine: 1.06 mg/dL — ABNORMAL HIGH (ref 0.44–1.00)
GFR, Est AFR Am: >60 mL/min (ref >60)
GFR, Estimated: 56 mL/min — ABNORMAL LOW (ref >60)
Glucose, Bld: 100 mg/dL — ABNORMAL HIGH (ref 70–99)
Potassium: 3.9 mmol/L (ref 3.5–5.1)
Sodium: 140 mmol/L (ref 135–145)
Total Bilirubin: 0.5 mg/dL (ref 0.3–1.2)
Total Protein: 6.9 g/dL (ref 6.5–8.1)

## 2018-09-01 LAB — LACTATE DEHYDROGENASE: LDH: 196 U/L — ABNORMAL HIGH (ref 98–192)

## 2018-09-01 LAB — SAVE SMEAR (SSMR)

## 2018-09-01 LAB — CBC WITH DIFFERENTIAL (CANCER CENTER ONLY)
Abs Immature Granulocytes: 0.06 10*3/uL (ref 0.00–0.07)
Basophils Absolute: 0.1 10*3/uL (ref 0.0–0.1)
Basophils Relative: 0 %
Eosinophils Absolute: 0.1 10*3/uL (ref 0.0–0.5)
Eosinophils Relative: 1 %
HCT: 39.2 % (ref 36.0–46.0)
Hemoglobin: 12.9 g/dL (ref 12.0–15.0)
Immature Granulocytes: 0 %
Lymphocytes Relative: 73 %
Lymphs Abs: 12.3 10*3/uL — ABNORMAL HIGH (ref 0.7–4.0)
MCH: 29.5 pg (ref 26.0–34.0)
MCHC: 32.9 g/dL (ref 30.0–36.0)
MCV: 89.5 fL (ref 80.0–100.0)
Monocytes Absolute: 0.8 10*3/uL (ref 0.1–1.0)
Monocytes Relative: 4 %
Neutro Abs: 3.8 10*3/uL (ref 1.7–7.7)
Neutrophils Relative %: 22 %
Platelet Count: 212 10*3/uL (ref 150–400)
RBC: 4.38 MIL/uL (ref 3.87–5.11)
RDW: 12.7 % (ref 11.5–15.5)
WBC Count: 17 10*3/uL — ABNORMAL HIGH (ref 4.0–10.5)
nRBC: 0 % (ref 0.0–0.2)

## 2018-09-01 NOTE — Progress Notes (Signed)
Hematology and Oncology Follow Up Visit  Alicia Snow [id number] 05-27-1955 63 y.o. 09/01/2018   Principle Diagnosis:  Stage A CLL  Current Therapy:    Observation     Interim History:  Ms.  Snow is back for followup.  We see her every 6 months.  She is managing to get through the coronavirus without any problems.  She is working from home.  She really enjoys working from home.  She is still trying to exercise.  Her weight, thankfully, has not gone up.  She is eating well.  She is trying to watch what she eats.  She has had no problems with infections.  There is been no fever.  She has had no swollen lymph nodes.  She still has a tiny little fullness on the right neck.  This might be a lymph node.  This might be a little lipoma.  It is not changed.  She has had no change in bowel or bladder habits.  She has had no nausea or vomiting.  She has had no bleeding.  Overall, her performance status is ECOG 0.   Medications:  Current Outpatient Medications:  .  co-enzyme Q-10 30 MG capsule, Take 200 mg by mouth daily. , Disp: , Rfl:  .  FISH OIL-KRILL OIL PO, Take 1 tablet by mouth every morning. , Disp: , Rfl:  .  FLUoxetine (PROZAC) 20 MG capsule, Take 20 mg by mouth daily., Disp: , Rfl:  .  Multiple Vitamins-Minerals (CENTRUM SILVER ULTRA WOMENS) TABS, Take 1 tablet by mouth daily. , Disp: , Rfl:  .  Probiotic Product (ALIGN) 4 MG CAPS, Take 1 capsule by mouth daily. , Disp: , Rfl:  .  rosuvastatin (CRESTOR) 5 MG tablet, Take 5 mg by mouth at bedtime., Disp: , Rfl:  .  VITAMIN D, ERGOCALCIFEROL, PO, Take by mouth daily. 5000IU daily, Disp: , Rfl:   Allergies:  Allergies  Allergen Reactions  . Nitrofurantoin Hives, Diarrhea and Rash    Past Medical History, Surgical history, Social history, and Family History were reviewed and updated.  Review of Systems: Review of Systems  Constitutional: Negative.   HENT: Negative.   Eyes: Negative.   Respiratory: Negative.    Cardiovascular: Negative.   Gastrointestinal: Negative.   Genitourinary: Negative.   Musculoskeletal: Negative.   Skin: Negative.   Neurological: Negative.   Endo/Heme/Allergies: Negative.   Psychiatric/Behavioral: Negative.     Physical Exam:  weight is 192 lb (87.1 kg). Her oral temperature is 97.8 F (36.6 C). Her blood pressure is 132/69 and her pulse is 70. Her respiration is 20 and oxygen saturation is 99%.   Physical Exam Vitals signs reviewed.  HENT:     Head: Normocephalic and atraumatic.  Eyes:     Pupils: Pupils are equal, round, and reactive to light.  Neck:     Musculoskeletal: Normal range of motion.  Cardiovascular:     Rate and Rhythm: Normal rate and regular rhythm.     Heart sounds: Normal heart sounds.  Pulmonary:     Effort: Pulmonary effort is normal.     Breath sounds: Normal breath sounds.  Abdominal:     General: Bowel sounds are normal.     Palpations: Abdomen is soft.  Musculoskeletal: Normal range of motion.        General: No tenderness or deformity.  Lymphadenopathy:     Cervical: No cervical adenopathy.  Skin:    General: Skin is warm and dry.     Findings:  No erythema or rash.     Comments: On the back of the right side of her neck, she does have a scaly slightly erythematous patch. It measures about 3 x 2 cm.  Neurological:     Mental Status: She is alert and oriented to person, place, and time.  Psychiatric:        Behavior: Behavior normal.        Thought Content: Thought content normal.        Judgment: Judgment normal.      Lab Results  Component Value Date   WBC 17.0 (H) 09/01/2018   HGB 12.9 09/01/2018   HCT 39.2 09/01/2018   MCV 89.5 09/01/2018   PLT 212 09/01/2018     Chemistry      Component Value Date/Time   NA 141 03/03/2018 0920   NA 141 11/07/2015 1130   K 4.8 03/03/2018 0920   K 3.9 11/07/2015 1130   CL 102 03/03/2018 0920   CL 103 08/01/2015 0932   CO2 27 03/03/2018 0920   CO2 22 11/07/2015 1130   BUN  21 03/03/2018 0920   BUN 23.9 11/07/2015 1130   CREATININE 1.08 (H) 03/03/2018 0920   CREATININE 0.9 11/07/2015 1130      Component Value Date/Time   CALCIUM 10.3 03/03/2018 0920   CALCIUM 9.5 11/07/2015 1130   ALKPHOS 74 03/03/2018 0920   ALKPHOS 88 11/07/2015 1130   AST 21 03/03/2018 0920   AST 19 11/07/2015 1130   ALT 22 03/03/2018 0920   ALT 21 11/07/2015 1130   BILITOT 0.6 03/03/2018 0920   BILITOT 0.68 11/07/2015 1130         Impression and Plan: Alicia Snow is a 63 year old white female with a CLL. Everything looks wonderful. I do not see any issues with the CLL. We've been following her now for 12 years.  In all honesty, she really has not changed at all.  She still works as young as when we first saw her 12 years ago.  For right now, we will continue to follow her along.  Hopefully, she will continue to lose some weight.  I think this will be her bigger problem going forward versus the CLL.  I just do not see any problems with the CLL that would indicate we have to treat her.  We will see her back in 6 months.  Volanda Napoleon, MD 8/24/20209:27 AM

## 2018-09-25 ENCOUNTER — Other Ambulatory Visit: Payer: Self-pay | Admitting: Family Medicine

## 2018-09-25 DIAGNOSIS — Z1231 Encounter for screening mammogram for malignant neoplasm of breast: Secondary | ICD-10-CM

## 2018-10-15 DIAGNOSIS — Z23 Encounter for immunization: Secondary | ICD-10-CM | POA: Diagnosis not present

## 2018-10-17 DIAGNOSIS — R519 Headache, unspecified: Secondary | ICD-10-CM | POA: Diagnosis not present

## 2018-10-17 DIAGNOSIS — K591 Functional diarrhea: Secondary | ICD-10-CM | POA: Diagnosis not present

## 2018-10-17 DIAGNOSIS — Z20828 Contact with and (suspected) exposure to other viral communicable diseases: Secondary | ICD-10-CM | POA: Diagnosis not present

## 2018-10-17 DIAGNOSIS — R509 Fever, unspecified: Secondary | ICD-10-CM | POA: Diagnosis not present

## 2018-11-10 ENCOUNTER — Other Ambulatory Visit: Payer: Self-pay

## 2018-11-10 ENCOUNTER — Ambulatory Visit
Admission: RE | Admit: 2018-11-10 | Discharge: 2018-11-10 | Disposition: A | Discharge status: Home / Self Care | Payer: BC Managed Care – PPO | Source: Ambulatory Visit | Attending: Family Medicine | Admitting: Family Medicine

## 2018-11-10 DIAGNOSIS — Z1231 Encounter for screening mammogram for malignant neoplasm of breast: Secondary | ICD-10-CM

## 2018-11-11 DIAGNOSIS — Z01419 Encounter for gynecological examination (general) (routine) without abnormal findings: Secondary | ICD-10-CM | POA: Diagnosis not present

## 2018-11-11 DIAGNOSIS — Z13820 Encounter for screening for osteoporosis: Secondary | ICD-10-CM | POA: Diagnosis not present

## 2018-12-08 DIAGNOSIS — Z13820 Encounter for screening for osteoporosis: Secondary | ICD-10-CM | POA: Diagnosis not present

## 2018-12-30 DIAGNOSIS — E782 Mixed hyperlipidemia: Secondary | ICD-10-CM | POA: Diagnosis not present

## 2018-12-30 DIAGNOSIS — Z0000 Encounter for general adult medical examination without abnormal findings: Secondary | ICD-10-CM | POA: Diagnosis not present

## 2018-12-30 DIAGNOSIS — Z23 Encounter for immunization: Secondary | ICD-10-CM | POA: Diagnosis not present

## 2018-12-30 DIAGNOSIS — C9110 Chronic lymphocytic leukemia of B-cell type not having achieved remission: Secondary | ICD-10-CM | POA: Diagnosis not present

## 2018-12-30 DIAGNOSIS — Z6836 Body mass index (BMI) 36.0-36.9, adult: Secondary | ICD-10-CM | POA: Diagnosis not present

## 2019-01-19 ENCOUNTER — Encounter: Payer: Self-pay | Admitting: Nurse Practitioner

## 2019-01-29 ENCOUNTER — Other Ambulatory Visit: Payer: Self-pay

## 2019-01-29 ENCOUNTER — Encounter: Payer: Self-pay | Admitting: Nurse Practitioner

## 2019-01-29 ENCOUNTER — Ambulatory Visit: Payer: BC Managed Care – PPO | Admitting: Nurse Practitioner

## 2019-01-29 VITALS — BP 110/70 | HR 81 | Temp 98.2°F | Ht 62.0 in | Wt 192.0 lb

## 2019-01-29 DIAGNOSIS — K648 Other hemorrhoids: Secondary | ICD-10-CM | POA: Diagnosis not present

## 2019-01-29 DIAGNOSIS — R194 Change in bowel habit: Secondary | ICD-10-CM

## 2019-01-29 NOTE — Progress Notes (Signed)
IMPRESSION and PLAN:    64 year old female with PMH significant for CLL, breast cancer , hyperlipidemia, nephrolithiasis, hemorrhoids, IBS, anxiety  IBS here with recent constipation ( with low carb diet) followed by loose stool over the last week. Her main concern is change of stool shape on occasion over the last couple of years ( flat ). Wants to make exclude rectal mass as cause of stool shape change. On anoscopy she does have internal hemorrhoids.  --Of course colon neoplasm can be excluded by today's exam alone but IBS can explain her symptoms and internal hemorrhoids can also affect stool shape Alicia Snow. Absence of unexplained weight loss, blood in stool are reassuring.  --Will treat internal hemorrhoids with a course of Prep H BID x 10 days. --Start daily Metamucil --If develops rectal bleeding and  or bowel movements fail to return to baseline, or improve over the next few weeks then she should call us back to discuss proceeding with colonoscopy at an earlier date.   HPI:    Primary GI: Alicia Lund, MD  Chief complaint : Alternating constipation and diarrhea (chronic) and hemorrhoids   Patient is a 64 year old female with a history of IBS and questionable Crohn's disease which was never proven.  Her last colonoscopy was in 2012 which showed mild diverticulosis, biopsies were negative for microscopic colitis.Marland Kitchen She was  last seen in the office April 2018. Alicia Snow started a low carb diet recently and it "tore up" her stomach. She became constipated with generalized abdominal pain. Stopped diet last week, since then having loose BMs. Averaging 2 loose, non-bloody BMs a day. Last week she did have a flat stool, got concerned the shape was caused by either hemorrhoids or maybe a mass. This isn't the first time her stool has had an odd shape over the last couple of years. She has chronic problems with alternating bowels. Eats wheat bread, broccoli , etc but can't be sure her fiber  intake is adequate. She has noticed a hemorrhoid but again no bleeding. No weight loss. Due for 10 year recall colonoscopy next year. Hgb in late August was at baseline ( 12.9).   Review of systems:     No chest pain, no SOB, no fevers, no urinary sx   Past Medical History:  Diagnosis Date  . Anal fissure   . Anxiety   . CLL (chronic lymphoblastic leukemia)   . Diverticulosis   . Hemorrhoids   . Hyperlipidemia   . IBS (irritable bowel syndrome)   . Nephrolithiasis   . Obesity     Patient's surgical history, family medical history, social history, medications and allergies were all reviewed in Epic   Current Outpatient Medications  Medication Sig Dispense Refill  . co-enzyme Q-10 30 MG capsule Take 200 mg by mouth daily.     Marland Kitchen FLUoxetine (PROZAC) 20 MG capsule Take 20 mg by mouth daily.    . Multiple Vitamins-Minerals (CENTRUM SILVER ULTRA WOMENS) TABS Take 1 tablet by mouth daily.     . Probiotic Product (ALIGN) 4 MG CAPS Take 1 capsule by mouth daily.     . rosuvastatin (CRESTOR) 5 MG tablet Take 5 mg by mouth at bedtime.    Marland Kitchen VITAMIN D, ERGOCALCIFEROL, PO Take by mouth daily. 5000IU daily     No current facility-administered medications for this visit.    Physical Exam:     BP 110/70   Pulse 81   Temp 98.2 F (36.8 C)  Ht 5\' 2"  (1.575 m)   Wt 192 lb (87.1 kg)   LMP 01/08/2006   BMI 35.12 kg/m   GENERAL:  Pleasant female in NAD PSYCH: : Cooperative, normal affect EENT:  conjunctiva pink, mucous membranes moist, neck supple without masses CARDIAC:  RRR,  no peripheral edema PULM: Normal respiratory effort, lungs CTA bilaterally, no wheezing ABDOMEN:  Nondistended, soft, nontender. No obvious masses, no hepatomegaly,  normal bowel sounds RECTAL:  Small mildly inflamed hemorrhoid, appears to be small protruding internal and partially reducible. Anoscopy : inflamed internal hemorrhoids SKIN:  turgor, no lesions seen Musculoskeletal:  Normal muscle tone, normal  strength NEURO: Alert and oriented x 3, no focal neurologic deficits  I spent 25 minutes of face-to-face time with the patient. Greater than 50% of the time was spent counseling and coordinating care. Questions answered  Alicia Snow , NP 01/29/2019, 3:23 PM

## 2019-01-29 NOTE — Patient Instructions (Addendum)
If you are age 63 or older, your body mass index should be between 23-30. Your Body mass index is 35.12 kg/m. If this is out of the aforementioned range listed, please consider follow up with your Primary Care Provider.  If you are age 82 or younger, your body mass index should be between 19-25. Your Body mass index is 35.12 kg/m. If this is out of the aformentioned range listed, please consider follow up with your Primary Care Provider.   Use Preparation H inside rectum twice daily for 10 days. (this is over-the-counter).  Take Metamucil once daily. (this is over-the-counter).  Please call with name of GI female physician that you are desiring to change to.  You have been placed on recall for colonoscopy 07/2020.  Thank you for choosing me and [facility].   [name], NP

## 2019-01-30 ENCOUNTER — Encounter: Payer: Self-pay | Admitting: Nurse Practitioner

## 2019-02-02 NOTE — Progress Notes (Signed)
____________________________________________________________  Attending physician addendum:  Thank you for sending this case to me. I have reviewed the entire note, and the outlined plan seems appropriate.  Bernedette Auston Danis, MD  ____________________________________________________________  

## 2019-02-04 ENCOUNTER — Telehealth: Payer: Self-pay | Admitting: Nurse Practitioner

## 2019-02-04 NOTE — Telephone Encounter (Signed)
Ok, I know Dr. Loletha Carrow already approved a transfer. I will talk to Dr. Tarri Glenn to make sure she will accept patient. Patient wants a female Gi.

## 2019-02-04 NOTE — Telephone Encounter (Signed)
Okay 

## 2019-02-23 ENCOUNTER — Other Ambulatory Visit: Payer: Self-pay

## 2019-02-23 DIAGNOSIS — H02403 Unspecified ptosis of bilateral eyelids: Secondary | ICD-10-CM | POA: Diagnosis not present

## 2019-02-23 DIAGNOSIS — H25013 Cortical age-related cataract, bilateral: Secondary | ICD-10-CM | POA: Diagnosis not present

## 2019-02-23 DIAGNOSIS — H2513 Age-related nuclear cataract, bilateral: Secondary | ICD-10-CM | POA: Diagnosis not present

## 2019-02-23 DIAGNOSIS — H35363 Drusen (degenerative) of macula, bilateral: Secondary | ICD-10-CM | POA: Diagnosis not present

## 2019-02-23 MED ORDER — ROSUVASTATIN CALCIUM 5 MG PO TABS: 5.0000 mg | ORAL_TABLET | Freq: Every day | ORAL | 2 refills | Status: DC

## 2019-03-04 ENCOUNTER — Inpatient Hospital Stay (HOSPITAL_BASED_OUTPATIENT_CLINIC_OR_DEPARTMENT_OTHER): Payer: BC Managed Care – PPO | Admitting: Hematology & Oncology

## 2019-03-04 ENCOUNTER — Encounter: Payer: Self-pay | Admitting: Hematology & Oncology

## 2019-03-04 ENCOUNTER — Inpatient Hospital Stay: Payer: BC Managed Care – PPO | Attending: Hematology & Oncology

## 2019-03-04 ENCOUNTER — Other Ambulatory Visit: Payer: Self-pay

## 2019-03-04 VITALS — BP 138/78 | HR 71 | Temp 97.6°F | Resp 20 | Wt 192.8 lb

## 2019-03-04 DIAGNOSIS — Z881 Allergy status to other antibiotic agents status: Secondary | ICD-10-CM | POA: Insufficient documentation

## 2019-03-04 DIAGNOSIS — C9110 Chronic lymphocytic leukemia of B-cell type not having achieved remission: Secondary | ICD-10-CM | POA: Diagnosis not present

## 2019-03-04 DIAGNOSIS — R634 Abnormal weight loss: Secondary | ICD-10-CM | POA: Diagnosis not present

## 2019-03-04 DIAGNOSIS — Z79899 Other long term (current) drug therapy: Secondary | ICD-10-CM | POA: Diagnosis not present

## 2019-03-04 LAB — CMP (CANCER CENTER ONLY)
ALT: 33 U/L (ref 0–44)
AST: 32 U/L (ref 15–41)
Albumin: 4.7 g/dL (ref 3.5–5.0)
Alkaline Phosphatase: 79 U/L (ref 38–126)
Anion gap: 11 (ref 5–15)
BUN: 28 mg/dL — ABNORMAL HIGH (ref 8–23)
CO2: 27 mmol/L (ref 22–32)
Calcium: 9.9 mg/dL (ref 8.9–10.3)
Chloride: 102 mmol/L (ref 98–111)
Creatinine: 1.08 mg/dL — ABNORMAL HIGH (ref 0.44–1.00)
GFR, Est AFR Am: >60 mL/min (ref >60)
GFR, Estimated: 55 mL/min — ABNORMAL LOW (ref >60)
Glucose, Bld: 108 mg/dL — ABNORMAL HIGH (ref 70–99)
Potassium: 4.5 mmol/L (ref 3.5–5.1)
Sodium: 140 mmol/L (ref 135–145)
Total Bilirubin: 0.8 mg/dL (ref 0.3–1.2)
Total Protein: 7.4 g/dL (ref 6.5–8.1)

## 2019-03-04 LAB — CBC WITH DIFFERENTIAL (CANCER CENTER ONLY)
Abs Immature Granulocytes: 0.03 10*3/uL (ref 0.00–0.07)
Basophils Absolute: 0.1 10*3/uL (ref 0.0–0.1)
Basophils Relative: 1 %
Eosinophils Absolute: 0.2 10*3/uL (ref 0.0–0.5)
Eosinophils Relative: 1 %
HCT: 40.1 % (ref 36.0–46.0)
Hemoglobin: 13.2 g/dL (ref 12.0–15.0)
Immature Granulocytes: 0 %
Lymphocytes Relative: 70 %
Lymphs Abs: 12.5 10*3/uL — ABNORMAL HIGH (ref 0.7–4.0)
MCH: 29.6 pg (ref 26.0–34.0)
MCHC: 32.9 g/dL (ref 30.0–36.0)
MCV: 89.9 fL (ref 80.0–100.0)
Monocytes Absolute: 0.7 10*3/uL (ref 0.1–1.0)
Monocytes Relative: 4 %
Neutro Abs: 4.3 10*3/uL (ref 1.7–7.7)
Neutrophils Relative %: 24 %
Platelet Count: 238 10*3/uL (ref 150–400)
RBC: 4.46 MIL/uL (ref 3.87–5.11)
RDW: 12.9 % (ref 11.5–15.5)
WBC Count: 17.8 10*3/uL — ABNORMAL HIGH (ref 4.0–10.5)
nRBC: 0 % (ref 0.0–0.2)

## 2019-03-04 LAB — LACTATE DEHYDROGENASE: LDH: 194 U/L — ABNORMAL HIGH (ref 98–192)

## 2019-03-04 LAB — SAVE SMEAR (SSMR)

## 2019-03-04 NOTE — Progress Notes (Signed)
Hematology and Oncology Follow Up Visit  Alicia Snow [id number] Feb 02, 1955 64 y.o. 03/04/2019   Principle Diagnosis:  Stage A CLL  Current Therapy:    Observation     Interim History:  Ms.  Alicia Snow is back for followup.  She really looks good.  She is done incredibly well despite the coronavirus pandemic.  She is still working from home for the most part.  Her birthday was on Valentine's Day.  She had lost a little bit of weight.  However, because of all the birthday cake, she put a little weight back on.  She has had no problems with infections.  She has had no swollen lymph nodes.  She has had no nausea or vomiting.  She has had no change in bowel or bladder habits.  She has had no rashes.  There is been no leg swelling.  She walks 3-1/2 miles a day.  With the warm weather right now, she would probably walk a little bit more.  Overall, her performance status is ECOG 0.   Medications:  Current Outpatient Medications:  .  co-enzyme Q-10 30 MG capsule, Take 200 mg by mouth daily. , Disp: , Rfl:  .  FLUoxetine (PROZAC) 20 MG capsule, Take 20 mg by mouth daily., Disp: , Rfl:  .  Multiple Vitamins-Minerals (CENTRUM SILVER ULTRA WOMENS) TABS, Take 1 tablet by mouth daily. , Disp: , Rfl:  .  Probiotic Product (ALIGN) 4 MG CAPS, Take 1 capsule by mouth daily. , Disp: , Rfl:  .  rosuvastatin (CRESTOR) 5 MG tablet, Take 1 tablet (5 mg total) by mouth at bedtime., Disp: 30 tablet, Rfl: 2 .  VITAMIN D, ERGOCALCIFEROL, PO, Take by mouth daily. 5000IU daily, Disp: , Rfl:   Allergies:  Allergies  Allergen Reactions  . Nitrofurantoin Hives, Diarrhea and Rash    Past Medical History, Surgical history, Social history, and Family History were reviewed and updated.  Review of Systems: Review of Systems  Constitutional: Negative.   HENT: Negative.   Eyes: Negative.   Respiratory: Negative.   Cardiovascular: Negative.   Gastrointestinal: Negative.   Genitourinary: Negative.    Musculoskeletal: Negative.   Skin: Negative.   Neurological: Negative.   Endo/Heme/Allergies: Negative.   Psychiatric/Behavioral: Negative.     Physical Exam:  weight is 192 lb 12.8 oz (87.5 kg). Her temporal temperature is 97.6 F (36.4 C). Her blood pressure is 138/78 and her pulse is 71. Her respiration is 20 and oxygen saturation is 100%.   Physical Exam Vitals reviewed.  HENT:     Head: Normocephalic and atraumatic.  Eyes:     Pupils: Pupils are equal, round, and reactive to light.  Cardiovascular:     Rate and Rhythm: Normal rate and regular rhythm.     Heart sounds: Normal heart sounds.  Pulmonary:     Effort: Pulmonary effort is normal.     Breath sounds: Normal breath sounds.  Abdominal:     General: Bowel sounds are normal.     Palpations: Abdomen is soft.  Musculoskeletal:        General: No tenderness or deformity. Normal range of motion.     Cervical back: Normal range of motion.  Lymphadenopathy:     Cervical: No cervical adenopathy.  Skin:    General: Skin is warm and dry.     Findings: No erythema or rash.     Comments: On the back of the right side of her neck, she does have a scaly slightly erythematous  patch. It measures about 3 x 2 cm.  Neurological:     Mental Status: She is alert and oriented to person, place, and time.  Psychiatric:        Behavior: Behavior normal.        Thought Content: Thought content normal.        Judgment: Judgment normal.      Lab Results  Component Value Date   WBC 17.8 (H) 03/04/2019   HGB 13.2 03/04/2019   HCT 40.1 03/04/2019   MCV 89.9 03/04/2019   PLT 238 03/04/2019     Chemistry      Component Value Date/Time   NA 140 09/01/2018 0904   NA 141 11/07/2015 1130   K 3.9 09/01/2018 0904   K 3.9 11/07/2015 1130   CL 102 09/01/2018 0904   CL 103 08/01/2015 0932   CO2 27 09/01/2018 0904   CO2 22 11/07/2015 1130   BUN 21 09/01/2018 0904   BUN 23.9 11/07/2015 1130   CREATININE 1.06 (H) 09/01/2018 0904    CREATININE 0.9 11/07/2015 1130      Component Value Date/Time   CALCIUM 9.4 09/01/2018 0904   CALCIUM 9.5 11/07/2015 1130   ALKPHOS 71 09/01/2018 0904   ALKPHOS 88 11/07/2015 1130   AST 24 09/01/2018 0904   AST 19 11/07/2015 1130   ALT 28 09/01/2018 0904   ALT 21 11/07/2015 1130   BILITOT 0.5 09/01/2018 0904   BILITOT 0.68 11/07/2015 1130         Impression and Plan: [name] is a 64 year old white female with a CLL. Everything looks wonderful. I do not see any issues with the CLL. We've been following her now for 12 years.  In all honesty, she really has not changed at all.  She still looks as young as when we first saw her 12 years ago.  For right now, we will continue to follow her along.  I will move her appointment out to every 8 months.  I told her that the weight loss must happen.  If not, her prognosis and mortality will clearly be based upon her excess weight.  Alicia Napoleon, MD 2/24/20219:36 AM

## 2019-04-02 ENCOUNTER — Ambulatory Visit: Payer: BC Managed Care – PPO | Attending: Internal Medicine

## 2019-04-02 DIAGNOSIS — Z23 Encounter for immunization: Secondary | ICD-10-CM

## 2019-04-02 NOTE — Progress Notes (Signed)
   Covid-19 Vaccination Clinic  Name:  Alicia Snow    MRN: [id number] DOB: Aug 12, 1955  04/02/2019  [name] was observed post Covid-19 immunization for 15 minutes without incident. She was provided with Vaccine Information Sheet and instruction to access the V-Safe system.   [name] was instructed to call 911 with any severe reactions post vaccine: Marland Kitchen Difficulty breathing  . Swelling of face and throat  . A fast heartbeat  . A bad rash all over body  . Dizziness and weakness   Immunizations Administered    Name Date Dose VIS Date Route   Pfizer COVID-19 Vaccine 04/02/2019 10:19 AM 0.3 mL 12/19/2018 Intramuscular   Manufacturer: [place]   Lot: [id number]   [place]: [id number]

## 2019-04-27 ENCOUNTER — Ambulatory Visit: Payer: BC Managed Care – PPO | Attending: Internal Medicine

## 2019-04-27 DIAGNOSIS — Z23 Encounter for immunization: Secondary | ICD-10-CM

## 2019-04-27 NOTE — Progress Notes (Signed)
   Covid-19 Vaccination Clinic  Name:  Alicia Snow    MRN: [id number] DOB: November 30, 1955  04/27/2019  [name] was observed post Covid-19 immunization for 15 minutes without incident. She was provided with Vaccine Information Sheet and instruction to access the V-Safe system.   [name] was instructed to call 911 with any severe reactions post vaccine: Marland Kitchen Difficulty breathing  . Swelling of face and throat  . A fast heartbeat  . A bad rash all over body  . Dizziness and weakness   Immunizations Administered    Name Date Dose VIS Date Route   Pfizer COVID-19 Vaccine 04/27/2019 10:22 AM 0.3 mL 03/04/2018 Intramuscular   Manufacturer: [place]   Lot: [id number]   [place]: [id number]

## 2019-05-13 ENCOUNTER — Other Ambulatory Visit: Payer: Self-pay | Admitting: Family Medicine

## 2019-05-13 DIAGNOSIS — E7801 Familial hypercholesterolemia: Secondary | ICD-10-CM

## 2019-06-30 ENCOUNTER — Ambulatory Visit: Payer: BC Managed Care – PPO | Admitting: Family Medicine

## 2019-07-08 ENCOUNTER — Ambulatory Visit (INDEPENDENT_AMBULATORY_CARE_PROVIDER_SITE_OTHER): Payer: BC Managed Care – PPO | Admitting: Family Medicine

## 2019-07-08 ENCOUNTER — Encounter (INDEPENDENT_AMBULATORY_CARE_PROVIDER_SITE_OTHER): Payer: Self-pay | Admitting: Family Medicine

## 2019-07-08 ENCOUNTER — Other Ambulatory Visit: Payer: Self-pay

## 2019-07-08 ENCOUNTER — Ambulatory Visit (INDEPENDENT_AMBULATORY_CARE_PROVIDER_SITE_OTHER): Payer: Self-pay | Admitting: Family Medicine

## 2019-07-08 VITALS — BP 128/63 | HR 68 | Temp 98.1°F | Ht 63.0 in | Wt 202.0 lb

## 2019-07-08 DIAGNOSIS — E7849 Other hyperlipidemia: Secondary | ICD-10-CM

## 2019-07-08 DIAGNOSIS — Z9189 Other specified personal risk factors, not elsewhere classified: Secondary | ICD-10-CM

## 2019-07-08 DIAGNOSIS — F418 Other specified anxiety disorders: Secondary | ICD-10-CM

## 2019-07-08 DIAGNOSIS — Z0289 Encounter for other administrative examinations: Secondary | ICD-10-CM

## 2019-07-08 DIAGNOSIS — E559 Vitamin D deficiency, unspecified: Secondary | ICD-10-CM | POA: Diagnosis not present

## 2019-07-08 DIAGNOSIS — R5383 Other fatigue: Secondary | ICD-10-CM | POA: Diagnosis not present

## 2019-07-08 DIAGNOSIS — Z6835 Body mass index (BMI) 35.0-35.9, adult: Secondary | ICD-10-CM

## 2019-07-08 DIAGNOSIS — R0602 Shortness of breath: Secondary | ICD-10-CM | POA: Diagnosis not present

## 2019-07-09 LAB — LIPID PANEL WITH LDL/HDL RATIO
Cholesterol, Total: 160 mg/dL (ref 100–199)
HDL: 65 mg/dL (ref >39)
LDL Chol Calc (NIH): 80 mg/dL (ref 0–99)
LDL/HDL Ratio: 1.2 ratio (ref 0.0–3.2)
Triglycerides: 82 mg/dL (ref 0–149)
VLDL Cholesterol Cal: 15 mg/dL (ref 5–40)

## 2019-07-09 LAB — CBC WITH DIFFERENTIAL/PLATELET
Basophils Absolute: 0.1 10*3/uL (ref 0.0–0.2)
Basos: 1 %
EOS (ABSOLUTE): 0.2 10*3/uL (ref 0.0–0.4)
Eos: 1 %
Hematocrit: 39.1 % (ref 34.0–46.6)
Hemoglobin: 12.9 g/dL (ref 11.1–15.9)
Immature Grans (Abs): 0 10*3/uL (ref 0.0–0.1)
Immature Granulocytes: 0 %
Lymphocytes Absolute: 15.7 10*3/uL — ABNORMAL HIGH (ref 0.7–3.1)
Lymphs: 76 %
MCH: 29.8 pg (ref 26.6–33.0)
MCHC: 33 g/dL (ref 31.5–35.7)
MCV: 90 fL (ref 79–97)
Monocytes Absolute: 0.6 10*3/uL (ref 0.1–0.9)
Monocytes: 3 %
Neutrophils Absolute: 3.9 10*3/uL (ref 1.4–7.0)
Neutrophils: 19 %
Platelets: 225 10*3/uL (ref 150–450)
RBC: 4.33 x10E6/uL (ref 3.77–5.28)
RDW: 12.8 % (ref 11.7–15.4)
WBC: 20.5 10*3/uL (ref 3.4–10.8)

## 2019-07-09 LAB — TSH: TSH: 1.29 u[IU]/mL (ref 0.450–4.500)

## 2019-07-09 LAB — T3: T3, Total: 117 ng/dL (ref 71–180)

## 2019-07-09 LAB — FOLATE: Folate: >20 ng/mL (ref >3.0)

## 2019-07-09 LAB — HEMOGLOBIN A1C
Est. average glucose Bld gHb Est-mCnc: 111 mg/dL
Hgb A1c MFr Bld: 5.5 % (ref 4.8–5.6)

## 2019-07-09 LAB — COMPREHENSIVE METABOLIC PANEL
ALT: 22 IU/L (ref 0–32)
AST: 23 IU/L (ref 0–40)
Albumin/Globulin Ratio: 2.9 — ABNORMAL HIGH (ref 1.2–2.2)
Albumin: 4.9 g/dL — ABNORMAL HIGH (ref 3.8–4.8)
Alkaline Phosphatase: 93 IU/L (ref 48–121)
BUN/Creatinine Ratio: 22 (ref 12–28)
BUN: 20 mg/dL (ref 8–27)
Bilirubin Total: 0.5 mg/dL (ref 0.0–1.2)
CO2: 22 mmol/L (ref 20–29)
Calcium: 9.8 mg/dL (ref 8.7–10.3)
Chloride: 99 mmol/L (ref 96–106)
Creatinine, Ser: 0.91 mg/dL (ref 0.57–1.00)
GFR calc Af Amer: 78 mL/min/{1.73_m2} (ref >59)
GFR calc non Af Amer: 67 mL/min/{1.73_m2} (ref >59)
Globulin, Total: 1.7 g/dL (ref 1.5–4.5)
Glucose: 82 mg/dL (ref 65–99)
Potassium: 4.2 mmol/L (ref 3.5–5.2)
Sodium: 140 mmol/L (ref 134–144)
Total Protein: 6.6 g/dL (ref 6.0–8.5)

## 2019-07-09 LAB — T4, FREE: Free T4: 1.06 ng/dL (ref 0.82–1.77)

## 2019-07-09 LAB — VITAMIN D 25 HYDROXY (VIT D DEFICIENCY, FRACTURES): Vit D, 25-Hydroxy: 71.5 ng/mL (ref 30.0–100.0)

## 2019-07-09 LAB — VITAMIN B12: Vitamin B-12: 553 pg/mL (ref 232–1245)

## 2019-07-09 LAB — INSULIN, RANDOM: INSULIN: 8.4 u[IU]/mL (ref 2.6–24.9)

## 2019-07-16 NOTE — Progress Notes (Signed)
Chief Complaint:   Alicia Snow (MR# [id number]) is a 64 y.o. female who presents for evaluation and treatment of obesity and related comorbidities. Current BMI is Body mass index is 35.78 kg/m. Alicia Snow has been struggling with her weight for many years and has been unsuccessful in either losing weight, maintaining weight loss, or reaching her healthy weight goal.  Alicia Snow heard about our clinic from her co-workers. Her husband will not be eating healthier with her. Breakfast-cheese toast, PB disk and coffee (doesn't feel full). Snack-cheese crackers. Lunch-[employer] roast beef sandwich with a side salad with low fat [place] dressing (feels a need for sweet), and has a sweet. Supper-Vegetable quesedilla with side of veggies or chick fil-a sandwich with fries, or parmesan crusted chicken salad. After dinner-ice cream or [employer] bar.  Alicia Snow is currently in the action stage of change and ready to dedicate time achieving and maintaining a healthier weight. Alicia Snow is interested in becoming our patient and working on intensive lifestyle modifications including (but not limited to) diet and exercise for weight loss.  Alicia Snow's habits were reviewed today and are as follows: Her family eats meals together, her desired weight loss is 62 lbs, she started gaining weight in her 50's, her heaviest weight ever was 200 pounds, she has significant food cravings issues, she frequently makes poor food choices, she frequently eats larger portions than normal and she struggles with emotional eating.  Depression Screen Alicia Snow Food and Mood (modified PHQ-9) score was 6.  Depression screen PHQ 2/9 07/08/2019  Decreased Interest 1  Down, Depressed, Hopeless 1  PHQ - 2 Score 2  Altered sleeping 0  Tired, decreased energy 3  Change in appetite 1  Feeling bad or failure about yourself  0  Trouble concentrating 0  Moving slowly or fidgety/restless 0  Suicidal thoughts 0  PHQ-9 Score 6  Difficult doing  work/chores Not difficult at all   Subjective:   1. Other fatigue Alicia Snow admits to daytime somnolence and admits to waking up still tired. Patent has a history of symptoms of daytime fatigue. Alicia Snow generally gets 6 or 8 hours of sleep per night, and states that she has generally restful sleep. Snoring is present. Apneic episodes are not present. Epworth Sleepiness Score is 7. EKG-normal sinus rhythm at 65 BPM.  2. SOB (shortness of breath) on exertion Alicia Snow notes increasing shortness of breath with exercising and seems to be worsening over time with weight gain. She notes getting out of breath sooner with activity than she used to. This has not gotten worse recently. Alicia Snow denies shortness of breath at rest or orthopnea.  3. Other hyperlipidemia Alicia Snow is on rosuvastatin 5 mg daily, and she denies myalgias.  4. Vitamin D deficiency Alicia Snow is not on Vit D supplementation, and she notes fatigue.  5. Depression with anxiety Alicia Snow is on Prozac 20 mg daily, and she denies suicidal ideas or homicidal ideas.  6. At risk for heart disease Alicia Snow is at a higher than average risk for cardiovascular disease due to obesity.   Assessment/Plan:   1. Other fatigue Alicia Snow does feel that her weight is causing her energy to be lower than it should be. Fatigue may be related to obesity, depression or many other causes. Labs will be ordered, and in the meanwhile, Alicia Snow will focus on self care including making healthy food choices, increasing physical activity and focusing on stress reduction.  - EKG 12-Lead - Vitamin B12 - Folate - T3 - T4, free -  TSH  2. SOB (shortness of breath) on exertion [name] does feel that she gets out of breath more easily that she used to when she exercises. Alicia Snow's shortness of breath appears to be obesity related and exercise induced. She has agreed to work on weight loss and gradually increase exercise to treat her exercise induced shortness of breath. Will continue to monitor  closely.  - Vitamin B12 - Folate - T3 - T4, free - TSH  3. Other hyperlipidemia Cardiovascular risk and specific lipid/LDL goals reviewed. We discussed several lifestyle modifications today and Alicia Snow will continue to work on diet, exercise and weight loss efforts. We will check labs today. Orders and follow up as documented in patient record.   Counseling Intensive lifestyle modifications are the first line treatment for this issue. . Dietary changes: Increase soluble fiber. Decrease simple carbohydrates. . Exercise changes: Moderate to vigorous-intensity aerobic activity 150 minutes per week if tolerated. . Lipid-lowering medications: see documented in medical record.  - Comprehensive metabolic panel - CBC with Differential/Platelet - Hemoglobin A1c - Insulin, random - Lipid Panel With LDL/HDL Ratio  4. Vitamin D deficiency Low Vitamin D level contributes to fatigue and are associated with obesity, breast, and colon cancer. We will check labs today. Alicia Snow will follow-up for routine testing of Vitamin D, at least 2-3 times per year to avoid over-replacement.  - VITAMIN D 25 Hydroxy (Vit-D Deficiency, Fractures)  5. Depression with anxiety Behavior modification techniques were discussed today to help Alicia Snow deal with her emotional/non-hunger eating behaviors. We will follow up at her next appointment. Orders and follow up as documented in patient record.   6. At risk for heart disease Alicia Snow was given approximately 15 minutes of coronary artery disease prevention counseling today. She is 64 y.o. female and has risk factors for heart disease including obesity. We discussed intensive lifestyle modifications today with an emphasis on specific weight loss instructions and strategies.   Repetitive spaced learning was employed today to elicit superior memory formation and behavioral change.  7. Class 2 severe obesity with serious comorbidity and body mass index (BMI) of 35.0 to 35.9 in  adult, unspecified obesity type (HCC) Alicia Snow is currently in the action stage of change and her goal is to continue with weight loss efforts. I recommend Alicia Snow begin the structured treatment plan as follows:  She has agreed to the Category 2 Plan + 100 calories.  Exercise goals: As is.   Behavioral modification strategies: increasing lean protein intake, increasing vegetables, meal planning and cooking strategies, keeping healthy foods in the home and planning for success.  She was informed of the importance of frequent follow-up visits to maximize her success with intensive lifestyle modifications for her multiple health conditions. She was informed we would discuss her lab results at her next visit unless there is a critical issue that needs to be addressed sooner. Alicia Snow agreed to keep her next visit at the agreed upon time to discuss these results.  Objective:   Blood pressure 128/63, pulse 68, temperature 98.1 F (36.7 C), temperature source Oral, height 5\' 3"  (1.6 m), weight 202 lb (91.6 kg), last menstrual period 01/08/2006, SpO2 97 %. Body mass index is 35.78 kg/m.  EKG: Normal sinus rhythm, rate 65 BPM.  Indirect Calorimeter completed today shows a VO2 of 225 and a REE of 1567.  Her calculated basal metabolic rate is 3614 thus her basal metabolic rate is worse than expected.  General: Cooperative, alert, well developed, in no acute distress. HEENT: Conjunctivae and  lids unremarkable. Cardiovascular: Regular rhythm.  Lungs: Normal work of breathing. Neurologic: No focal deficits.   Lab Results  Component Value Date   CREATININE 0.91 07/08/2019   BUN 20 07/08/2019   NA 140 07/08/2019   K 4.2 07/08/2019   CL 99 07/08/2019   CO2 22 07/08/2019   Lab Results  Component Value Date   ALT 22 07/08/2019   AST 23 07/08/2019   ALKPHOS 93 07/08/2019   BILITOT 0.5 07/08/2019   Lab Results  Component Value Date   HGBA1C 5.5 07/08/2019   Lab Results  Component Value Date    INSULIN 8.4 07/08/2019   Lab Results  Component Value Date   TSH 1.290 07/08/2019   Lab Results  Component Value Date   CHOL 160 07/08/2019   HDL 65 07/08/2019   LDLCALC 80 07/08/2019   TRIG 82 07/08/2019   CHOLHDL 4.3 11/07/2015   Lab Results  Component Value Date   WBC 20.5 (HH) 07/08/2019   HGB 12.9 07/08/2019   HCT 39.1 07/08/2019   MCV 90 07/08/2019   PLT 225 07/08/2019   Lab Results  Component Value Date   FERRITIN 52 07/17/2007   Attestation Statements:   This is the patient's first visit at [facility]. The patient's NEW PATIENT PACKET was reviewed at length. Included in the packet: current and past health history, medications, allergies, ROS, gynecologic history (women only), surgical history, family history, social history, weight history, weight loss surgery history (for those that have had weight loss surgery), nutritional evaluation, mood and food questionnaire, PHQ9, Epworth questionnaire, sleep habits questionnaire, patient life and health improvement goals questionnaire. These will all be scanned into the patient's chart under media.   During the visit, I independently reviewed the patient's EKG, bioimpedance scale results, and indirect calorimeter results. I used this information to tailor a meal plan for the patient that will help her to lose weight and will improve her obesity-related conditions going forward. I performed a medically necessary appropriate examination and/or evaluation. I discussed the assessment and treatment plan with the patient. The patient was provided an opportunity to ask questions and all were answered. The patient agreed with the plan and demonstrated an understanding of the instructions. Labs were ordered at this visit and will be reviewed at the next visit unless more critical results need to be addressed immediately. Clinical information was updated and documented in the EMR.   Time spent on visit including pre-visit chart  review and post-visit care was 45 minutes.   A separate 15 minutes was spent on risk counseling (see above).    I, Trixie Dredge, am acting as transcriptionist for Coralie Common, MD.  I have reviewed the above documentation for accuracy and completeness, and I agree with the above. - Jinny Blossom, MD

## 2019-07-22 ENCOUNTER — Other Ambulatory Visit: Payer: Self-pay

## 2019-07-22 ENCOUNTER — Ambulatory Visit (INDEPENDENT_AMBULATORY_CARE_PROVIDER_SITE_OTHER): Payer: BC Managed Care – PPO | Admitting: Family Medicine

## 2019-07-22 ENCOUNTER — Encounter (INDEPENDENT_AMBULATORY_CARE_PROVIDER_SITE_OTHER): Payer: Self-pay | Admitting: Family Medicine

## 2019-07-22 VITALS — BP 122/69 | HR 65 | Temp 98.0°F | Ht 63.0 in | Wt 197.0 lb

## 2019-07-22 DIAGNOSIS — E8881 Metabolic syndrome: Secondary | ICD-10-CM

## 2019-07-22 DIAGNOSIS — E7849 Other hyperlipidemia: Secondary | ICD-10-CM | POA: Diagnosis not present

## 2019-07-22 DIAGNOSIS — Z6835 Body mass index (BMI) 35.0-35.9, adult: Secondary | ICD-10-CM

## 2019-07-22 DIAGNOSIS — E559 Vitamin D deficiency, unspecified: Secondary | ICD-10-CM

## 2019-07-28 NOTE — Progress Notes (Signed)
Chief Complaint:   OBESITY Alicia Snow is here to discuss her progress with her obesity treatment plan along with follow-up of her obesity related diagnoses. Alicia Snow is on the Category 2 Plan + 100 calories and states she is following her eating plan approximately 75% of the time. Alicia Snow states she is walking 3-4 miles 2-3 times per week.  Today's visit was #: 2 Starting weight: 202 lbs Starting date: 07/08/2019 Today's weight: 197 lbs Today's date: 07/22/2019 Total lbs lost to date: 5 Total lbs lost since last in-office visit: 5  Interim History: Alicia Snow felt the meal prep was on point with the meal plan. She did have a small indulgence of a dessert she made for the 4th of July. She did order an [employer] sandwich but she threw the top roll away. She didn't feel the plan was too difficult. Snacks of [place] bars, Atkins bars, and sometimes she didn't feel satisfied from the meals.  Subjective:   1. Vitamin D deficiency Alicia Snow's last Vit D level was 71.5. She denies nausea, vomiting, or muscle weakness, but she notes fatigue. I discussed labs with the patient today.  2. Insulin resistance Alicia Snow's Hgb A1c was 5.5 and insulin 8.4. She is not on medications. I discussed labs with the patient today.  3. Other hyperlipidemia Alicia Snow is on statin and denies transaminitis. Last LDL was 80, HDL 65, and triglycerides 82. I discussed labs with the patient today.  Assessment/Plan:   1. Vitamin D deficiency Low Vitamin D level contributes to fatigue and are associated with obesity, breast, and colon cancer. Alicia Snow agreed to continue taking OTC Vitamin D, no change in dosage. She will follow-up for routine testing of Vitamin D, at least 2-3 times per year to avoid over-replacement.  2. Insulin resistance Alicia Snow will continue to work on weight loss, exercise, and decreasing simple carbohydrates to help decrease the risk of diabetes. We will follow up on labs in 3 months. Alicia Snow agreed to follow-up with [place] as  directed to closely monitor her progress.  3. Other hyperlipidemia Cardiovascular risk and specific lipid/LDL goals reviewed. We discussed several lifestyle modifications today and Alicia Snow will continue to work on diet, exercise and weight loss efforts. Alicia Snow will continue statin, no change in dose. Orders and follow up as documented in patient record.   Counseling Intensive lifestyle modifications are the first line treatment for this issue. . Dietary changes: Increase soluble fiber. Decrease simple carbohydrates. . Exercise changes: Moderate to vigorous-intensity aerobic activity 150 minutes per week if tolerated. . Lipid-lowering medications: see documented in medical record.  4. Class 2 severe obesity with serious comorbidity and body mass index (BMI) of 35.0 to 35.9 in adult, unspecified obesity type (HCC) Alicia Snow is currently in the action stage of change. As such, her goal is to continue with weight loss efforts. She has agreed to the Category 2 Plan + 100 calories.   Exercise goals: As is.  Behavioral modification strategies: increasing lean protein intake and meal planning and cooking strategies.  Alicia Snow has agreed to follow-up with our clinic in 2 to 3 weeks. She was informed of the importance of frequent follow-up visits to maximize her success with intensive lifestyle modifications for her multiple health conditions.   Objective:   Blood pressure 122/69, pulse 65, temperature 98 F (36.7 C), temperature source Oral, height 5\' 3"  (1.6 m), weight 197 lb (89.4 kg), last menstrual period 01/08/2006, SpO2 98 %. Body mass index is 34.9 kg/m.  General: Cooperative, alert, well developed, in  no acute distress. HEENT: Conjunctivae and lids unremarkable. Cardiovascular: Regular rhythm.  Lungs: Normal work of breathing. Neurologic: No focal deficits.   Lab Results  Component Value Date   CREATININE 0.91 07/08/2019   BUN 20 07/08/2019   NA 140 07/08/2019   K 4.2 07/08/2019   CL 99  07/08/2019   CO2 22 07/08/2019   Lab Results  Component Value Date   ALT 22 07/08/2019   AST 23 07/08/2019   ALKPHOS 93 07/08/2019   BILITOT 0.5 07/08/2019   Lab Results  Component Value Date   HGBA1C 5.5 07/08/2019   Lab Results  Component Value Date   INSULIN 8.4 07/08/2019   Lab Results  Component Value Date   TSH 1.290 07/08/2019   Lab Results  Component Value Date   CHOL 160 07/08/2019   HDL 65 07/08/2019   LDLCALC 80 07/08/2019   TRIG 82 07/08/2019   CHOLHDL 4.3 11/07/2015   Lab Results  Component Value Date   WBC 20.5 (HH) 07/08/2019   HGB 12.9 07/08/2019   HCT 39.1 07/08/2019   MCV 90 07/08/2019   PLT 225 07/08/2019   Lab Results  Component Value Date   FERRITIN 52 07/17/2007   Attestation Statements:   Reviewed by clinician on day of visit: allergies, medications, problem list, medical history, surgical history, family history, social history, and previous encounter notes.  Time spent on visit including pre-visit chart review and post-visit care and charting was 35 minutes.    I, Trixie Dredge, am acting as transcriptionist for Coralie Common, MD.  I have reviewed the above documentation for accuracy and completeness, and I agree with the above. - Jinny Blossom, MD

## 2019-08-11 ENCOUNTER — Encounter (INDEPENDENT_AMBULATORY_CARE_PROVIDER_SITE_OTHER): Payer: Self-pay | Admitting: Family Medicine

## 2019-08-11 ENCOUNTER — Ambulatory Visit (INDEPENDENT_AMBULATORY_CARE_PROVIDER_SITE_OTHER): Payer: BC Managed Care – PPO | Admitting: Family Medicine

## 2019-08-11 ENCOUNTER — Other Ambulatory Visit: Payer: Self-pay

## 2019-08-11 VITALS — BP 125/77 | HR 60 | Temp 97.7°F | Ht 63.0 in | Wt 191.0 lb

## 2019-08-11 DIAGNOSIS — E669 Obesity, unspecified: Secondary | ICD-10-CM

## 2019-08-11 DIAGNOSIS — Z6833 Body mass index (BMI) 33.0-33.9, adult: Secondary | ICD-10-CM | POA: Diagnosis not present

## 2019-08-11 DIAGNOSIS — K5909 Other constipation: Secondary | ICD-10-CM | POA: Diagnosis not present

## 2019-08-11 NOTE — Progress Notes (Signed)
Chief Complaint:   OBESITY Alicia Snow is here to discuss her progress with her obesity treatment plan along with follow-up of her obesity related diagnoses. Alicia Snow is on the Category 2 Plan + 100 calories and states she is following her eating plan approximately 75% of the time. Alicia Snow states she is walking 3.5 miles for 70 minutes 3 times per week.  Today's visit was #: 3 Starting weight: 202 lbs Starting date: 07/08/2019 Today's weight: 191 lbs Today's date: 08/11/2019 Total lbs lost to date: 11 Total lbs lost since last in-office visit: 6  Interim History: Alicia Snow continues to do well with weight loss on her Category 2 plan. She is gotten bored with breakfast but her hunger is controlled.  Subjective:   1. Other constipation Alicia Snow notes increased abdominal bloating and decreased BM frequency since starting her eating plan and losing weight. She knows her water intake is not ideal.  Assessment/Plan:   1. Other constipation Alicia Snow was informed that a decrease in bowel movement frequency is normal while losing weight, but stools should not be hard or painful. Alicia Snow is to increase her water and fiber rich foods, and she is ok to take OTC Benefiber if needed. Orders and follow up as documented in patient record.   2. Class 1 obesity with serious comorbidity and body mass index (BMI) of 33.0 to 33.9 in adult, unspecified obesity type Alicia Snow is currently in the action stage of change. As such, her goal is to continue with weight loss efforts. She has agreed to the Category 2 Plan + 100 calories with breakfast options.   Exercise goals: As is.  Behavioral modification strategies: increasing water intake and increasing high fiber foods.  Alicia Snow has agreed to follow-up with our clinic in 2 weeks with Dr. Jearld Shines. She was informed of the importance of frequent follow-up visits to maximize her success with intensive lifestyle modifications for her multiple health conditions.   Objective:   Blood  pressure 125/77, pulse 60, temperature 97.7 F (36.5 C), temperature source Oral, height 5\' 3"  (1.6 m), weight 191 lb (86.6 kg), last menstrual period 01/08/2006, SpO2 100 %. Body mass index is 33.83 kg/m.  General: Cooperative, alert, well developed, in no acute distress. HEENT: Conjunctivae and lids unremarkable. Cardiovascular: Regular rhythm.  Lungs: Normal work of breathing. Neurologic: No focal deficits.   Lab Results  Component Value Date   CREATININE 0.91 07/08/2019   BUN 20 07/08/2019   NA 140 07/08/2019   K 4.2 07/08/2019   CL 99 07/08/2019   CO2 22 07/08/2019   Lab Results  Component Value Date   ALT 22 07/08/2019   AST 23 07/08/2019   ALKPHOS 93 07/08/2019   BILITOT 0.5 07/08/2019   Lab Results  Component Value Date   HGBA1C 5.5 07/08/2019   Lab Results  Component Value Date   INSULIN 8.4 07/08/2019   Lab Results  Component Value Date   TSH 1.290 07/08/2019   Lab Results  Component Value Date   CHOL 160 07/08/2019   HDL 65 07/08/2019   LDLCALC 80 07/08/2019   TRIG 82 07/08/2019   CHOLHDL 4.3 11/07/2015   Lab Results  Component Value Date   WBC 20.5 (HH) 07/08/2019   HGB 12.9 07/08/2019   HCT 39.1 07/08/2019   MCV 90 07/08/2019   PLT 225 07/08/2019   Lab Results  Component Value Date   FERRITIN 52 07/17/2007   Attestation Statements:   Reviewed by clinician on day of visit: allergies, medications,  problem list, medical history, surgical history, family history, social history, and previous encounter notes.  Time spent on visit including pre-visit chart review and post-visit care and charting was 32 minutes.    I, Trixie Dredge, am acting as transcriptionist for Dennard Nip, MD.  I have reviewed the above documentation for accuracy and completeness, and I agree with the above. -  Dennard Nip, MD

## 2019-08-25 ENCOUNTER — Other Ambulatory Visit: Payer: Self-pay

## 2019-08-25 ENCOUNTER — Ambulatory Visit (INDEPENDENT_AMBULATORY_CARE_PROVIDER_SITE_OTHER): Payer: BC Managed Care – PPO | Admitting: Family Medicine

## 2019-08-25 ENCOUNTER — Encounter (INDEPENDENT_AMBULATORY_CARE_PROVIDER_SITE_OTHER): Payer: Self-pay | Admitting: Family Medicine

## 2019-08-25 VITALS — BP 115/72 | HR 65 | Temp 97.9°F | Ht 63.0 in | Wt 188.0 lb

## 2019-08-25 DIAGNOSIS — E8881 Metabolic syndrome: Secondary | ICD-10-CM

## 2019-08-25 DIAGNOSIS — E669 Obesity, unspecified: Secondary | ICD-10-CM | POA: Diagnosis not present

## 2019-08-25 DIAGNOSIS — E559 Vitamin D deficiency, unspecified: Secondary | ICD-10-CM

## 2019-08-25 DIAGNOSIS — Z6833 Body mass index (BMI) 33.0-33.9, adult: Secondary | ICD-10-CM

## 2019-08-25 NOTE — Progress Notes (Signed)
Chief Complaint:   OBESITY Alicia Snow is here to discuss her progress with her obesity treatment plan along with follow-up of her obesity related diagnoses. Alicia Snow is on the Category 2 Plan + 100 calories and states she is following her eating plan approximately 80% of the time. Alicia Snow states she is walking 3.5 miles 3-4 times per week, and weights 2 times per week.  Today's visit was #: 4 Starting weight: 202 lbs Starting date: 07/08/2019 Today's weight: 188 lbs Today's date: 08/25/2019 Total lbs lost to date: 14 Total lbs lost since last in-office visit: 3  Interim History: Alicia Snow voices some disappointment in weight loss of 3 lbs (was hoping for more). Breakfast is 3 Blima Singer [place] sausage, 1 egg and 2 slices of 45 calorie bread. Lunch is a sandwich with a peach. Supper is grilled chicken with bbq sauce and vegetables. She is doing Engineering geologist and Atkins bar for snacks. She is not weighing her protein at lunch or dinner. She notes hunger.  Subjective:   1. Vitamin D deficiency Alicia Snow denies nausea, vomiting, or muscle weakness, but she notes fatigue. She is on Vit D, and last Vit D level was 71.5.  2. Insulin resistance Alicia Snow's last A1c was 5.5 and insulin 8.4. She is not on any medications.  Assessment/Plan:   1. Vitamin D deficiency Low Vitamin D level contributes to fatigue and are associated with obesity, breast, and colon cancer. Alicia Snow agreed to continue taking OTC Vitamin D and will follow-up for routine testing of Vitamin D, at least 2-3 times per year to avoid over-replacement.  2. Insulin resistance Alicia Snow will continue to work on weight loss, exercise, and decreasing simple carbohydrates to help decrease the risk of diabetes. We will follow up on labs in 2 months. Alicia Snow agreed to follow-up with [place] as directed to closely monitor her progress.  3. Class 1 obesity with serious comorbidity and body mass index (BMI) of 33.0 to 33.9 in adult, unspecified obesity type Alicia Snow is  currently in the action stage of change. As such, her goal is to continue with weight loss efforts. She has agreed to the Category 2 Plan + 100 calories.   Alicia Snow is to increase her protein intake and gauge hunger.  Exercise goals: All adults should avoid inactivity. Some physical activity is better than none, and adults who participate in any amount of physical activity gain some health benefits.  Behavioral modification strategies: increasing lean protein intake, meal planning and cooking strategies, keeping healthy foods in the home and planning for success.  Alicia Snow has agreed to follow-up with our clinic in 3 weeks. She was informed of the importance of frequent follow-up visits to maximize her success with intensive lifestyle modifications for her multiple health conditions.   Objective:   Blood pressure 115/72, pulse 65, temperature 97.9 F (36.6 C), temperature source Oral, height 5\' 3"  (1.6 m), weight 188 lb (85.3 kg), last menstrual period 01/08/2006, SpO2 98 %. Body mass index is 33.3 kg/m.  General: Cooperative, alert, well developed, in no acute distress. HEENT: Conjunctivae and lids unremarkable. Cardiovascular: Regular rhythm.  Lungs: Normal work of breathing. Neurologic: No focal deficits.   Lab Results  Component Value Date   CREATININE 0.91 07/08/2019   BUN 20 07/08/2019   NA 140 07/08/2019   K 4.2 07/08/2019   CL 99 07/08/2019   CO2 22 07/08/2019   Lab Results  Component Value Date   ALT 22 07/08/2019   AST 23 07/08/2019   ALKPHOS  93 07/08/2019   BILITOT 0.5 07/08/2019   Lab Results  Component Value Date   HGBA1C 5.5 07/08/2019   Lab Results  Component Value Date   INSULIN 8.4 07/08/2019   Lab Results  Component Value Date   TSH 1.290 07/08/2019   Lab Results  Component Value Date   CHOL 160 07/08/2019   HDL 65 07/08/2019   LDLCALC 80 07/08/2019   TRIG 82 07/08/2019   CHOLHDL 4.3 11/07/2015   Lab Results  Component Value Date   WBC 20.5  (HH) 07/08/2019   HGB 12.9 07/08/2019   HCT 39.1 07/08/2019   MCV 90 07/08/2019   PLT 225 07/08/2019   Lab Results  Component Value Date   FERRITIN 52 07/17/2007   Attestation Statements:   Reviewed by clinician on day of visit: allergies, medications, problem list, medical history, surgical history, family history, social history, and previous encounter notes.  Time spent on visit including pre-visit chart review and post-visit care and charting was 15 minutes.    I, Alicia Snow, am acting as transcriptionist for Coralie Common, MD.  I have reviewed the above documentation for accuracy and completeness, and I agree with the above. - Jinny Blossom, MD

## 2019-09-17 ENCOUNTER — Encounter (INDEPENDENT_AMBULATORY_CARE_PROVIDER_SITE_OTHER): Payer: Self-pay | Admitting: Family Medicine

## 2019-09-17 ENCOUNTER — Other Ambulatory Visit: Payer: Self-pay

## 2019-09-17 ENCOUNTER — Ambulatory Visit (INDEPENDENT_AMBULATORY_CARE_PROVIDER_SITE_OTHER): Payer: BC Managed Care – PPO | Admitting: Family Medicine

## 2019-09-17 VITALS — BP 106/66 | HR 62 | Temp 98.3°F | Ht 63.0 in | Wt 183.0 lb

## 2019-09-17 DIAGNOSIS — E559 Vitamin D deficiency, unspecified: Secondary | ICD-10-CM | POA: Diagnosis not present

## 2019-09-17 DIAGNOSIS — E7849 Other hyperlipidemia: Secondary | ICD-10-CM | POA: Diagnosis not present

## 2019-09-17 DIAGNOSIS — Z6832 Body mass index (BMI) 32.0-32.9, adult: Secondary | ICD-10-CM

## 2019-09-17 DIAGNOSIS — E669 Obesity, unspecified: Secondary | ICD-10-CM

## 2019-09-17 NOTE — Progress Notes (Signed)
Chief Complaint:   OBESITY Alicia Snow is here to discuss her progress with her obesity treatment plan along with follow-up of her obesity related diagnoses. Alicia Snow is on the Category 2 Plan + 100 calories and states she is following her eating plan approximately 75% of the time. Alicia Snow states she is walking 2-3 miles 2-3 times per week.  Today's visit was #: 5 Starting weight: 202 lbs Starting date: 07/08/2019 Today's weight: 183 lbs Today's date: 09/17/2019 Total lbs lost to date: 19 Total lbs lost since last in-office visit: 5  Interim History: Alicia Snow voices she feels content on the plan. She did have some BBQ without a bun and a lice of hershey bar cake that patient has been able to enjoy and not spiral. She is doing egg option for breakfast, sandwich option for lunch, and dinner is chicken with broccoli and cabbage. Snack is lemon bar atkins bar and chocolate brownie [place]. Sometimes she is hungry.  Subjective:   1. Other hyperlipidemia Alicia Snow is on statin, and last LFTs were within normal limits. She denies myalgias. Last FLP showed, LDL 80, triglycerides 82, and HDL 65.  2. Vitamin D deficiency Alicia Snow denies nausea, vomiting, or muscle weakness, but she notes fatigue. She is on 5,000 IU OTC Vit D daily.  Assessment/Plan:   1. Other hyperlipidemia Cardiovascular risk and specific lipid/LDL goals reviewed. We discussed several lifestyle modifications today and Alicia Snow will continue to work on diet, exercise and weight loss efforts. We will repeat labs at the end of October. Orders and follow up as documented in patient record.   Counseling Intensive lifestyle modifications are the first line treatment for this issue. . Dietary changes: Increase soluble fiber. Decrease simple carbohydrates. . Exercise changes: Moderate to vigorous-intensity aerobic activity 150 minutes per week if tolerated. . Lipid-lowering medications: see documented in medical record.  2. Vitamin D deficiency Low  Vitamin D level contributes to fatigue and are associated with obesity, breast, and colon cancer. Alicia Snow agreed to continue taking OTC Vitamin D 5,000 IU daily and will follow-up for routine testing of Vitamin D, at least 2-3 times per year to avoid over-replacement.  3. Class 1 obesity with serious comorbidity and body mass index (BMI) of 32.0 to 32.9 in adult, unspecified obesity type Alicia Snow is currently in the action stage of change. As such, her goal is to continue with weight loss efforts. She has agreed to the Category 2 Plan + 100 calories and 8 oz of meat at dinner.   Exercise goals: All adults should avoid inactivity. Some physical activity is better than none, and adults who participate in any amount of physical activity gain some health benefits.  Behavioral modification strategies: increasing lean protein intake, meal planning and cooking strategies, keeping healthy foods in the home and planning for success.  Alicia Snow has agreed to follow-up with our clinic in 3 weeks. She was informed of the importance of frequent follow-up visits to maximize her success with intensive lifestyle modifications for her multiple health conditions.   Objective:   Blood pressure 106/66, pulse 62, temperature 98.3 F (36.8 C), temperature source Oral, height 5\' 3"  (1.6 m), weight 183 lb (83 kg), last menstrual period 01/08/2006, SpO2 97 %. Body mass index is 32.42 kg/m.  General: Cooperative, alert, well developed, in no acute distress. HEENT: Conjunctivae and lids unremarkable. Cardiovascular: Regular rhythm.  Lungs: Normal work of breathing. Neurologic: No focal deficits.   Lab Results  Component Value Date   CREATININE 0.91 07/08/2019  BUN 20 07/08/2019   NA 140 07/08/2019   K 4.2 07/08/2019   CL 99 07/08/2019   CO2 22 07/08/2019   Lab Results  Component Value Date   ALT 22 07/08/2019   AST 23 07/08/2019   ALKPHOS 93 07/08/2019   BILITOT 0.5 07/08/2019   Lab Results  Component Value  Date   HGBA1C 5.5 07/08/2019   Lab Results  Component Value Date   INSULIN 8.4 07/08/2019   Lab Results  Component Value Date   TSH 1.290 07/08/2019   Lab Results  Component Value Date   CHOL 160 07/08/2019   HDL 65 07/08/2019   LDLCALC 80 07/08/2019   TRIG 82 07/08/2019   CHOLHDL 4.3 11/07/2015   Lab Results  Component Value Date   WBC 20.5 (HH) 07/08/2019   HGB 12.9 07/08/2019   HCT 39.1 07/08/2019   MCV 90 07/08/2019   PLT 225 07/08/2019   Lab Results  Component Value Date   FERRITIN 52 07/17/2007   Attestation Statements:   Reviewed by clinician on day of visit: allergies, medications, problem list, medical history, surgical history, family history, social history, and previous encounter notes.  Time spent on visit including pre-visit chart review and post-visit care and charting was 15 minutes.    I, Trixie Dredge, am acting as transcriptionist for Coralie Common, MD.  I have reviewed the above documentation for accuracy and completeness, and I agree with the above. - Jinny Blossom, MD

## 2019-10-08 ENCOUNTER — Other Ambulatory Visit: Payer: Self-pay

## 2019-10-08 ENCOUNTER — Encounter (INDEPENDENT_AMBULATORY_CARE_PROVIDER_SITE_OTHER): Payer: Self-pay | Admitting: Family Medicine

## 2019-10-08 ENCOUNTER — Ambulatory Visit (INDEPENDENT_AMBULATORY_CARE_PROVIDER_SITE_OTHER): Payer: BC Managed Care – PPO | Admitting: Family Medicine

## 2019-10-08 VITALS — BP 110/70 | HR 59 | Temp 98.2°F | Ht 63.0 in | Wt 179.0 lb

## 2019-10-08 DIAGNOSIS — E88819 Insulin resistance, unspecified: Secondary | ICD-10-CM

## 2019-10-08 DIAGNOSIS — E8881 Metabolic syndrome: Secondary | ICD-10-CM

## 2019-10-08 DIAGNOSIS — E669 Obesity, unspecified: Secondary | ICD-10-CM | POA: Insufficient documentation

## 2019-10-08 DIAGNOSIS — E66811 Obesity, class 1: Secondary | ICD-10-CM | POA: Insufficient documentation

## 2019-10-08 DIAGNOSIS — Z6831 Body mass index (BMI) 31.0-31.9, adult: Secondary | ICD-10-CM

## 2019-10-08 HISTORY — DX: Insulin resistance, unspecified: E88.819

## 2019-10-08 NOTE — Progress Notes (Signed)
Chief Complaint:   OBESITY Alicia Snow is here to discuss her progress with her obesity treatment plan along with follow-up of her obesity related diagnoses. Alicia Snow is on the Category 2 Plan + 100 calories with 8 oz of meat at dinner and states she is following her eating plan approximately 75% of the time. Alicia Snow states she is walking 3 miles and doing resistance occasionally 4 times per week.   Today's visit was #: 6 Starting weight: 202 lbs Starting date: 07/08/2019 Today's weight: 179 lbs Today's date: 10/08/2019 Total lbs lost to date: 23 Total lbs lost since last in-office visit: 4  Interim History: Alicia Snow is doing a fabulous job on the plan. She is down 23 lbs since July 4th, 2021. She feels the plan is easy and she likes the variety of foods. She has some hunger after lunch. She does not weigh the meat at lunch.  Subjective:   1. Insulin resistance Alicia Snow has a diagnosis of insulin resistance based on her elevated fasting insulin level >5. Alicia Snow's hunger is mostly satisfied. She notes hunger in the afternoon.  Lab Results  Component Value Date   INSULIN 8.4 07/08/2019   Lab Results  Component Value Date   HGBA1C 5.5 07/08/2019   Assessment/Plan:   1. Insulin resistance  We discussed metformin and she declined.  2. Class 1 obesity with serious comorbidity and body mass index (BMI) of 31.0 to 31.9 in adult, unspecified obesity type Alicia Snow is currently in the action stage of change. As such, her goal is to continue with weight loss efforts. She has agreed to the Category 2 Plan.   Alicia Snow will have 6 oz of meat rather than 8 oz. at supper and add protein equivalent snack in the afternoon.  Exercise goals: As is, and add resistance 2 times per week.  Behavioral modification strategies: increasing lean protein intake, increasing water intake and better snacking choices.  Alicia Snow has agreed to follow-up with our clinic in 3 weeks.  Objective:   Blood pressure 110/70, pulse (!)  59, temperature 98.2 F (36.8 C), temperature source Oral, height 5\' 3"  (1.6 m), weight 179 lb (81.2 kg), last menstrual period 01/08/2006, SpO2 96 %. Body mass index is 31.71 kg/m.  General: Cooperative, alert, well developed, in no acute distress. HEENT: Conjunctivae and lids unremarkable. Cardiovascular: Regular rhythm.  Lungs: Normal work of breathing. Neurologic: No focal deficits.   Lab Results  Component Value Date   CREATININE 0.91 07/08/2019   BUN 20 07/08/2019   NA 140 07/08/2019   K 4.2 07/08/2019   CL 99 07/08/2019   CO2 22 07/08/2019   Lab Results  Component Value Date   ALT 22 07/08/2019   AST 23 07/08/2019   ALKPHOS 93 07/08/2019   BILITOT 0.5 07/08/2019   Lab Results  Component Value Date   HGBA1C 5.5 07/08/2019   Lab Results  Component Value Date   INSULIN 8.4 07/08/2019   Lab Results  Component Value Date   TSH 1.290 07/08/2019   Lab Results  Component Value Date   CHOL 160 07/08/2019   HDL 65 07/08/2019   LDLCALC 80 07/08/2019   TRIG 82 07/08/2019   CHOLHDL 4.3 11/07/2015   Lab Results  Component Value Date   WBC 20.5 (HH) 07/08/2019   HGB 12.9 07/08/2019   HCT 39.1 07/08/2019   MCV 90 07/08/2019   PLT 225 07/08/2019   Lab Results  Component Value Date   FERRITIN 52 07/17/2007   Attestation Statements:  Reviewed by clinician on day of visit: allergies, medications, problem list, medical history, surgical history, family history, social history, and previous encounter notes.   Wilhemena Durie, am acting as Location manager for [employer], FNP-C.  I have reviewed the above documentation for accuracy and completeness, and I agree with the above. -  Georgianne Fick, FNP

## 2019-10-14 ENCOUNTER — Other Ambulatory Visit: Payer: Self-pay | Admitting: Family Medicine

## 2019-10-14 ENCOUNTER — Encounter: Payer: Self-pay | Admitting: Family Medicine

## 2019-10-14 ENCOUNTER — Ambulatory Visit (INDEPENDENT_AMBULATORY_CARE_PROVIDER_SITE_OTHER): Payer: BC Managed Care – PPO

## 2019-10-14 ENCOUNTER — Other Ambulatory Visit: Payer: Self-pay

## 2019-10-14 DIAGNOSIS — Z23 Encounter for immunization: Secondary | ICD-10-CM

## 2019-10-14 DIAGNOSIS — Z1231 Encounter for screening mammogram for malignant neoplasm of breast: Secondary | ICD-10-CM

## 2019-10-14 NOTE — Progress Notes (Signed)
    Patient Name: Alicia Snow Patient DOB: [birth year]  10/14/2019  [name] came in today for her 2021-2022 influenza vaccine.  Patient tolerated it well.   [name]

## 2019-10-22 ENCOUNTER — Ambulatory Visit: Payer: BC Managed Care – PPO

## 2019-10-27 ENCOUNTER — Encounter (INDEPENDENT_AMBULATORY_CARE_PROVIDER_SITE_OTHER): Payer: Self-pay

## 2019-10-27 ENCOUNTER — Encounter (INDEPENDENT_AMBULATORY_CARE_PROVIDER_SITE_OTHER): Payer: Self-pay | Admitting: Family Medicine

## 2019-10-27 ENCOUNTER — Telehealth (INDEPENDENT_AMBULATORY_CARE_PROVIDER_SITE_OTHER): Payer: Self-pay

## 2019-10-27 ENCOUNTER — Telehealth (INDEPENDENT_AMBULATORY_CARE_PROVIDER_SITE_OTHER): Payer: BC Managed Care – PPO | Admitting: Family Medicine

## 2019-10-27 DIAGNOSIS — Z6831 Body mass index (BMI) 31.0-31.9, adult: Secondary | ICD-10-CM | POA: Diagnosis not present

## 2019-10-27 DIAGNOSIS — E669 Obesity, unspecified: Secondary | ICD-10-CM | POA: Diagnosis not present

## 2019-10-27 DIAGNOSIS — E8881 Metabolic syndrome: Secondary | ICD-10-CM

## 2019-10-27 DIAGNOSIS — E559 Vitamin D deficiency, unspecified: Secondary | ICD-10-CM

## 2019-10-27 NOTE — Telephone Encounter (Signed)
I connected with  Alicia Snow on 10/27/19 by a video enabled telemedicine application and verified that I am speaking with the correct person using two identifiers.   I discussed the limitations of evaluation and management by telemedicine. The patient expressed understanding and agreed to proceed.

## 2019-11-03 NOTE — Progress Notes (Signed)
TeleHealth Visit:  Due to the COVID-19 pandemic, this visit was completed with telemedicine (audio/video) technology to reduce patient and provider exposure as well as to preserve personal protective equipment.   Alicia Snow has verbally consented to this TeleHealth visit. The patient is located at home, the provider is located at the [facility] office. The participants in this visit include the listed provider and patient. The visit was conducted today via video.  Chief Complaint: OBESITY Alicia Snow is here to discuss her progress with her obesity treatment plan along with follow-up of her obesity related diagnoses. Alicia Snow is on the Category 2 Plan + 100 calories and states she is following her eating plan approximately 60% of the time. Alicia Snow states she walked 10 miles last week and 3.3 miles last night.   Today's visit was #: 7 Starting weight: 202 lbs Starting date: 07/08/2019  Interim History: Alicia Snow voices that she is currently taking care of her sister who just had back surgery and is trying to get her brother into an assisted living type facility. She reports having an upper respiratory infection and reports eating off plan ~40% of the time. She has a birthday dinner this weekend. She occasionally is having to make the most nutritious choices.  Subjective:   Vitamin D deficiency. Alicia Snow is Vitamin D 5,000 IU daily. No nausea, vomiting, or muscle weakness. She endorses fatigue.   Ref. Range 07/08/2019 14:20  Vitamin D, 25-Hydroxy Latest Ref Range: 30.0 - 100.0 ng/mL 71.5   Insulin resistance. Alicia Snow has a diagnosis of insulin resistance based on her elevated fasting insulin level >5. She continues to work on diet and exercise to decrease her risk of diabetes. She reports occasional carb cravings. She is on no medication.  Lab Results  Component Value Date   INSULIN 8.4 07/08/2019   Lab Results  Component Value Date   HGBA1C 5.5 07/08/2019   Assessment/Plan:    Vitamin D deficiency. Low Vitamin D level contributes to fatigue and are associated with obesity, breast, and colon cancer. She will follow-up for routine testing of Vitamin D early to mid-November.  Insulin resistance. Alicia Snow will continue to work on weight loss, exercise, and decreasing simple carbohydrates to help decrease the risk of diabetes. Alicia Snow agreed to follow-up with [place] as directed to closely monitor her progress. Labs will be checked in 1 month.  Class 1 obesity with serious comorbidity and body mass index (BMI) of 31.0 to 31.9 in adult, unspecified obesity type.  Alicia Snow is currently in the action stage of change. As such, her goal is to continue with weight loss efforts. She has agreed to the Category 2 Plan + 100 calories and will journal 400-500 calories and 35+ grams of protein at supper.   Exercise goals: All adults should avoid inactivity. Some physical activity is better than none, and adults who participate in any amount of physical activity gain some health benefits.  Behavioral modification strategies: increasing lean protein intake, meal planning and cooking strategies, keeping healthy foods in the home and planning for success.  Alicia Snow has agreed to follow-up with our clinic in 3 weeks. She was informed of the importance of frequent follow-up visits to maximize her success with intensive lifestyle modifications for her multiple health conditions.  Objective:   VITALS: Per patient if applicable, see vitals. GENERAL: Alert and in no acute distress. CARDIOPULMONARY: No increased WOB. Speaking in clear sentences.  PSYCH: Pleasant and cooperative. Speech normal rate and rhythm. Affect is appropriate. Insight  and judgement are appropriate. Attention is focused, linear, and appropriate.  NEURO: Oriented as arrived to appointment on time with no prompting.   Lab Results  Component Value Date   CREATININE 0.91 07/08/2019   BUN 20 07/08/2019   NA 140 07/08/2019   K 4.2  07/08/2019   CL 99 07/08/2019   CO2 22 07/08/2019   Lab Results  Component Value Date   ALT 22 07/08/2019   AST 23 07/08/2019   ALKPHOS 93 07/08/2019   BILITOT 0.5 07/08/2019   Lab Results  Component Value Date   HGBA1C 5.5 07/08/2019   Lab Results  Component Value Date   INSULIN 8.4 07/08/2019   Lab Results  Component Value Date   TSH 1.290 07/08/2019   Lab Results  Component Value Date   CHOL 160 07/08/2019   HDL 65 07/08/2019   LDLCALC 80 07/08/2019   TRIG 82 07/08/2019   CHOLHDL 4.3 11/07/2015   Lab Results  Component Value Date   WBC 20.5 (HH) 07/08/2019   HGB 12.9 07/08/2019   HCT 39.1 07/08/2019   MCV 90 07/08/2019   PLT 225 07/08/2019   Lab Results  Component Value Date   FERRITIN 52 07/17/2007   Attestation Statements:   Reviewed by clinician on day of visit: allergies, medications, problem list, medical history, surgical history, family history, social history, and previous encounter notes.  I, Michaelene Song, am acting as transcriptionist for Coralie Common, MD   I have reviewed the above documentation for accuracy and completeness, and I agree with the above. - Jinny Blossom, MD

## 2019-11-04 ENCOUNTER — Other Ambulatory Visit: Payer: BC Managed Care – PPO

## 2019-11-04 ENCOUNTER — Ambulatory Visit: Payer: BC Managed Care – PPO | Admitting: Family

## 2019-11-09 ENCOUNTER — Encounter (INDEPENDENT_AMBULATORY_CARE_PROVIDER_SITE_OTHER): Payer: Self-pay

## 2019-11-11 ENCOUNTER — Ambulatory Visit
Admission: RE | Admit: 2019-11-11 | Discharge: 2019-11-11 | Disposition: A | Discharge status: Home / Self Care | Payer: BC Managed Care – PPO | Source: Ambulatory Visit | Attending: Family Medicine | Admitting: Family Medicine

## 2019-11-11 ENCOUNTER — Other Ambulatory Visit: Payer: Self-pay

## 2019-11-11 DIAGNOSIS — Z1231 Encounter for screening mammogram for malignant neoplasm of breast: Secondary | ICD-10-CM

## 2019-11-17 ENCOUNTER — Ambulatory Visit (INDEPENDENT_AMBULATORY_CARE_PROVIDER_SITE_OTHER): Payer: BC Managed Care – PPO | Admitting: Family Medicine

## 2019-11-18 DIAGNOSIS — Z1231 Encounter for screening mammogram for malignant neoplasm of breast: Secondary | ICD-10-CM | POA: Diagnosis not present

## 2019-11-18 DIAGNOSIS — Z01419 Encounter for gynecological examination (general) (routine) without abnormal findings: Secondary | ICD-10-CM | POA: Diagnosis not present

## 2019-11-26 ENCOUNTER — Ambulatory Visit (INDEPENDENT_AMBULATORY_CARE_PROVIDER_SITE_OTHER): Payer: BC Managed Care – PPO | Admitting: Family Medicine

## 2019-12-15 ENCOUNTER — Ambulatory Visit (INDEPENDENT_AMBULATORY_CARE_PROVIDER_SITE_OTHER): Payer: BC Managed Care – PPO | Admitting: Family Medicine

## 2020-01-04 ENCOUNTER — Encounter: Payer: Self-pay | Admitting: Family Medicine

## 2020-01-04 ENCOUNTER — Other Ambulatory Visit: Payer: Self-pay

## 2020-01-04 ENCOUNTER — Ambulatory Visit (INDEPENDENT_AMBULATORY_CARE_PROVIDER_SITE_OTHER): Payer: BC Managed Care – PPO | Admitting: Family Medicine

## 2020-01-04 VITALS — BP 116/58 | HR 76 | Temp 97.2°F | Resp 16 | Ht 63.0 in | Wt 187.8 lb

## 2020-01-04 DIAGNOSIS — R7303 Prediabetes: Secondary | ICD-10-CM

## 2020-01-04 DIAGNOSIS — C9110 Chronic lymphocytic leukemia of B-cell type not having achieved remission: Secondary | ICD-10-CM

## 2020-01-04 DIAGNOSIS — E6609 Other obesity due to excess calories: Secondary | ICD-10-CM | POA: Diagnosis not present

## 2020-01-04 DIAGNOSIS — E782 Mixed hyperlipidemia: Secondary | ICD-10-CM | POA: Diagnosis not present

## 2020-01-04 DIAGNOSIS — N952 Postmenopausal atrophic vaginitis: Secondary | ICD-10-CM

## 2020-01-04 DIAGNOSIS — Z0000 Encounter for general adult medical examination without abnormal findings: Secondary | ICD-10-CM | POA: Diagnosis not present

## 2020-01-04 DIAGNOSIS — Z6833 Body mass index (BMI) 33.0-33.9, adult: Secondary | ICD-10-CM

## 2020-01-04 MED ORDER — ESTROGENS, CONJUGATED 0.625 MG/GM VA CREA: 1.0000 | TOPICAL_CREAM | Freq: Every day | VAGINAL | 12 refills | Status: DC

## 2020-01-04 NOTE — Addendum Note (Signed)
Addended byBlane Ohara on: 01/04/2020 09:20 PM   Modules accepted: Orders

## 2020-01-04 NOTE — Patient Instructions (Signed)
Recommend [employer].  Recommend premarin cream apply once daily x 2 weeks, then decrease to maintenance dose twice weekly   Preventive Care 63-63 Years Old, Female Preventive care refers to visits with your health care provider and lifestyle choices that can promote health and wellness. This includes:  A yearly physical exam. This may also be called an annual well check.  Regular dental visits and eye exams.  Immunizations.  Screening for certain conditions.  Healthy lifestyle choices, such as eating a healthy diet, getting regular exercise, not using drugs or products that contain nicotine and tobacco, and limiting alcohol use. What can I expect for my preventive care visit? Physical exam Your health care provider will check your:  Height and weight. This may be used to calculate body mass index (BMI), which tells if you are at a healthy weight.  Heart rate and blood pressure.  Skin for abnormal spots. Counseling Your health care provider may ask you questions about your:  Alcohol, tobacco, and drug use.  Emotional well-being.  Home and relationship well-being.  Sexual activity.  Eating habits.  Work and work Statistician.  Method of birth control.  Menstrual cycle.  Pregnancy history. What immunizations do I need?  Influenza (flu) vaccine  This is recommended every year. Tetanus, diphtheria, and pertussis (Tdap) vaccine  You may need a Td booster every 10 years. Varicella (chickenpox) vaccine  You may need this if you have not been vaccinated. Zoster (shingles) vaccine  You may need this after age 59. Measles, mumps, and rubella (MMR) vaccine  You may need at least one dose of MMR if you were born in 1957 or later. You may also need a second dose. Pneumococcal conjugate (PCV13) vaccine  You may need this if you have certain conditions and were not previously vaccinated. Pneumococcal polysaccharide (PPSV23) vaccine  You may need one or two  doses if you smoke cigarettes or if you have certain conditions. Meningococcal conjugate (MenACWY) vaccine  You may need this if you have certain conditions. Hepatitis A vaccine  You may need this if you have certain conditions or if you travel or work in places where you may be exposed to hepatitis A. Hepatitis B vaccine  You may need this if you have certain conditions or if you travel or work in places where you may be exposed to hepatitis B. Haemophilus influenzae type b (Hib) vaccine  You may need this if you have certain conditions. Human papillomavirus (HPV) vaccine  If recommended by your health care provider, you may need three doses over 6 months. You may receive vaccines as individual doses or as more than one vaccine together in one shot (combination vaccines). Talk with your health care provider about the risks and benefits of combination vaccines. What tests do I need? Blood tests  Lipid and cholesterol levels. These may be checked every 5 years, or more frequently if you are over 72 years old.  Hepatitis C test.  Hepatitis B test. Screening  Lung cancer screening. You may have this screening every year starting at age 10 if you have a 30-pack-year history of smoking and currently smoke or have quit within the past 15 years.  Colorectal cancer screening. All adults should have this screening starting at age 52 and continuing until age 3. Your health care provider may recommend screening at age 63 if you are at increased risk. You will have tests every 1-10 years, depending on your results and the type of screening test.  Diabetes screening.  This is done by checking your blood sugar (glucose) after you have not eaten for a while (fasting). You may have this done every 1-3 years.  Mammogram. This may be done every 1-2 years. Talk with your health care provider about when you should start having regular mammograms. This may depend on whether you have a family history of  breast cancer.  BRCA-related cancer screening. This may be done if you have a family history of breast, ovarian, tubal, or peritoneal cancers.  Pelvic exam and Pap test. This may be done every 3 years starting at age 29. Starting at age 4, this may be done every 5 years if you have a Pap test in combination with an HPV test. Other tests  Sexually transmitted disease (STD) testing.  Bone density scan. This is done to screen for osteoporosis. You may have this scan if you are at high risk for osteoporosis. Follow these instructions at home: Eating and drinking  Eat a diet that includes fresh fruits and vegetables, whole grains, lean protein, and low-fat dairy.  Take vitamin and mineral supplements as recommended by your health care provider.  Do not drink alcohol if: ? Your health care provider tells you not to drink. ? You are pregnant, may be pregnant, or are planning to become pregnant.  If you drink alcohol: ? Limit how much you have to 0-1 drink a day. ? Be aware of how much alcohol is in your drink. In the [place] one drink equals one 12 oz bottle of beer (355 mL), one 5 oz glass of wine (148 mL), or one 1 oz glass of hard liquor (44 mL). Lifestyle  Take daily care of your teeth and gums.  Stay active. Exercise for at least 30 minutes on 5 or more days each week.  Do not use any products that contain nicotine or tobacco, such as cigarettes, e-cigarettes, and chewing tobacco. If you need help quitting, ask your health care provider.  If you are sexually active, practice safe sex. Use a condom or other form of birth control (contraception) in order to prevent pregnancy and STIs (sexually transmitted infections).  If told by your health care provider, take low-dose aspirin daily starting at age 18. What's next?  Visit your health care provider once a year for a well check visit.  Ask your health care provider how often you should have your eyes and teeth checked.  Stay up to  date on all vaccines. This information is not intended to replace advice given to you by your health care provider. Make sure you discuss any questions you have with your health care provider. Document Revised: 09/05/2017 Document Reviewed: 09/05/2017 Elsevier Patient Education  2020 [employer].

## 2020-01-04 NOTE — Progress Notes (Addendum)
Subjective:  Patient ID: Alicia Snow, female    DOB: 05/16/1955  Age: 64 y.o. MRN: [id number]  Chief Complaint  Patient presents with  . Annual Exam    HPI Well Adult Physical: Patient here for a comprehensive physical exam.The patient reports no problems Do you take any herbs or supplements that were not prescribed by a doctor? yes Are you taking calcium supplements? no Are you taking aspirin daily? no  Encounter for general adult medical examination without abnormal findings  Physical ("At Risk" items are starred): Patient's last physical exam was 1 year ago .  DIET: pt is going [facility] and is on the plan #2. She has lost 23 lbs. She feels great.  Smoking: Life-long non-smoker ;  Physical Activity: Walking 3-4 miles per day ;  Alcohol/Drug Use: Is a non-drinker ; No illicit drug use ;  Patient is not afflicted from Stress Incontinence and Urge Incontinence  Safety: reviewed. Patient wears a seat belt, has smoke detectors, has carbon monoxide detectors, wears sunscreen with extended sun exposure. Dental Care: biannual cleanings, brushes and flosses daily. Due now, but dentist just retired.  Ophthalmology/Optometry: Annual visit.  Hearing loss: none Vision impairments: none  Menarche: 64 yo Menstrual History: menopause LMP: 64 yo Pregnancy history: none Safe at home: yes Self breast exams: yes Mammogram 10/2019 normal.  Pap 11/2019 normal per pt report. Sees gynecology.    [place] Office Visit from 01/04/2020 in [place]  PHQ-2 Total Score 0      Current Exercise Habits: Home exercise routine, Type of exercise: walking, Time (Minutes): > 60, Frequency (Times/Week): 4, Weekly Exercise (Minutes/Week): 0, Intensity: Moderate    Social Hx   Social History   Socioeconomic History  . Marital status: Married    Spouse name: Not on file  . Number of children: 0  . Years of education: Not on file  . Highest education level: Not on file   Occupational History  . Occupation: para legal  Tobacco Use  . Smoking status: Never Smoker  . Smokeless tobacco: Never Used  . Tobacco comment: never used tobacco  Vaping Use  . Vaping Use: Never used  Substance and Sexual Activity  . Alcohol use: No    Alcohol/week: 0.0 standard drinks  . Drug use: No  . Sexual activity: Not on file  Other Topics Concern  . Not on file  Social History Narrative  . Not on file   Social Determinants of Health   Financial Resource Strain: Not on file  Food Insecurity: Not on file  Transportation Needs: Not on file  Physical Activity: Not on file  Stress: Not on file  Social Connections: Not on file   Past Medical History:  Diagnosis Date  . Anal fissure   . Anxiety   . Back pain   . CLL (chronic lymphoblastic leukemia)   . Constipation   . Depression   . Diverticulosis   . Hemorrhoids   . Hyperlipidemia   . IBS (irritable bowel syndrome)   . Nephrolithiasis   . Obesity    Past Surgical History:  Procedure Laterality Date  . ANAL FISSURE REPAIR    . excision of polyp and external anal tag    . LITHOTRIPSY      Family History  Problem Relation Age of Onset  . Breast cancer Mother 90       died of breast cancer  . Cancer Mother   . Heart disease Father   .  Hypertension Father   . Hyperlipidemia Father   . Breast cancer Maternal Grandmother 25       died of breast cancer  . Breast cancer Paternal Grandmother        died of breast ca - age unknown  . Heart disease Paternal Grandmother   . Heart disease Paternal Grandfather   . Diabetes Sister        x2  . Liver cancer Maternal Grandfather   . Diabetes Brother   . Stroke Brother   . Colon cancer Neg Hx   . Esophageal cancer Neg Hx   . Rectal cancer Neg Hx     Review of Systems  Constitutional: Negative for chills, fatigue and fever.  HENT: Negative for congestion, ear pain and sore throat.   Respiratory: Negative for cough and shortness of breath.    Cardiovascular: Negative for chest pain.  Gastrointestinal: Negative for abdominal pain, constipation, diarrhea, nausea and vomiting.  Genitourinary: Negative for dysuria and urgency.  Musculoskeletal: Negative for arthralgias and myalgias.  Skin: Negative for rash.  Neurological: Negative for dizziness and headaches.  Psychiatric/Behavioral: Negative for dysphoric mood. The patient is not nervous/anxious.      Objective:  BP (!) 116/58   Pulse 76   Temp (!) 97.2 F (36.2 C)   Resp 16   Ht 5\' 3"  (1.6 m)   Wt 187 lb 12.8 oz (85.2 kg)   LMP 01/08/2006   BMI 33.27 kg/m   BP/Weight 01/04/2020 [id number]  Systolic BP [id number]  Diastolic BP 58 70 66  Wt. (Lbs) 187.8 179 183  BMI 33.27 31.71 32.42    Physical Exam  Lab Results  Component Value Date   WBC 20.5 (HH) 07/08/2019   HGB 12.9 07/08/2019   HCT 39.1 07/08/2019   PLT 225 07/08/2019   GLUCOSE 82 07/08/2019   CHOL 160 07/08/2019   TRIG 82 07/08/2019   HDL 65 07/08/2019   LDLCALC 80 07/08/2019   ALT 22 07/08/2019   AST 23 07/08/2019   NA 140 07/08/2019   K 4.2 07/08/2019   CL 99 07/08/2019   CREATININE 0.91 07/08/2019   BUN 20 07/08/2019   CO2 22 07/08/2019   TSH 1.290 07/08/2019   HGBA1C 5.5 07/08/2019      Assessment & Plan:  1. Well adult exam Continue follow-up with healthy weight and wellness program. Has Pap smear and mammograms done by gynecology. - CBC with Differential/Platelet - Comprehensive metabolic panel - Hemoglobin A1c - Lipid panel - TSH  2. Mixed hyperlipidemia Checking cholesterol today. 3. Prediabetes - Hemoglobin A1c  4. Class 1 obesity due to excess calories with serious comorbidity and body mass index (BMI) of 33.0 to 33.9 in adult Continue to work on eating healthy and exercise.  5. CLL (chronic lymphocytic leukemia) (HCC) CBC today.  Patient to follow-up with oncology her regularly scheduled basis.  6.  Atrophic vaginitis (reviewed Dr. Jon Billings exam)  also matches history. Recommend premarin cream apply once daily x 2 weeks, then decrease to maintenance dose twice weekly    This is a list of the screening recommended for you and due dates:  Health Maintenance  Topic Date Due  .  Hepatitis C: One time screening is recommended by Center for Disease Control  (CDC) for  adults born from [birth year] through 1964.   Never done  . HIV Screening  Never done  . Tetanus Vaccine  Never done  . Pap Smear  Never done  . COVID-19 Vaccine (  4 - Booster for [employer] series) 05/23/2020  . Colon Cancer Screening  07/10/2020  . Mammogram  11/10/2021  . Flu Shot  Completed     AN INDIVIDUALIZED CARE PLAN: was established or reinforced today.   SELF MANAGEMENT: The patient and I together assessed ways to personally work towards obtaining the recommended goals  Support needs The patient and/or family needs were assessed and services were offered if appropriate.   Follow-up: Return in about 1 year (around 01/03/2021) for Fasting.  An After Visit Summary was printed and given to the patient.  Blane Ohara, MD [facility] [phone]

## 2020-01-05 ENCOUNTER — Other Ambulatory Visit: Payer: Self-pay

## 2020-01-05 ENCOUNTER — Other Ambulatory Visit: Payer: Self-pay | Admitting: Family Medicine

## 2020-01-05 ENCOUNTER — Telehealth: Payer: Self-pay

## 2020-01-05 LAB — CBC WITH DIFFERENTIAL/PLATELET
Basophils Absolute: 0.1 10*3/uL (ref 0.0–0.2)
Basos: 1 %
EOS (ABSOLUTE): 0.2 10*3/uL (ref 0.0–0.4)
Eos: 1 %
Hematocrit: 39.4 % (ref 34.0–46.6)
Hemoglobin: 13.3 g/dL (ref 11.1–15.9)
Immature Grans (Abs): 0 10*3/uL (ref 0.0–0.1)
Immature Granulocytes: 0 %
Lymphocytes Absolute: 14.4 10*3/uL — ABNORMAL HIGH (ref 0.7–3.1)
Lymphs: 70 %
MCH: 29.8 pg (ref 26.6–33.0)
MCHC: 33.8 g/dL (ref 31.5–35.7)
MCV: 88 fL (ref 79–97)
Monocytes Absolute: 0.6 10*3/uL (ref 0.1–0.9)
Monocytes: 3 %
Neutrophils Absolute: 5 10*3/uL (ref 1.4–7.0)
Neutrophils: 25 %
Platelets: 230 10*3/uL (ref 150–450)
RBC: 4.47 x10E6/uL (ref 3.77–5.28)
RDW: 12.6 % (ref 11.7–15.4)
WBC: 20.4 10*3/uL (ref 3.4–10.8)

## 2020-01-05 LAB — COMPREHENSIVE METABOLIC PANEL
ALT: 20 IU/L (ref 0–32)
AST: 20 IU/L (ref 0–40)
Albumin/Globulin Ratio: 2.4 — ABNORMAL HIGH (ref 1.2–2.2)
Albumin: 4.7 g/dL (ref 3.8–4.8)
Alkaline Phosphatase: 92 IU/L (ref 44–121)
BUN/Creatinine Ratio: 20 (ref 12–28)
BUN: 21 mg/dL (ref 8–27)
Bilirubin Total: 0.4 mg/dL (ref 0.0–1.2)
CO2: 22 mmol/L (ref 20–29)
Calcium: 9.9 mg/dL (ref 8.7–10.3)
Chloride: 102 mmol/L (ref 96–106)
Creatinine, Ser: 1.05 mg/dL — ABNORMAL HIGH (ref 0.57–1.00)
GFR calc Af Amer: 65 mL/min/{1.73_m2} (ref >59)
GFR calc non Af Amer: 57 mL/min/{1.73_m2} — ABNORMAL LOW (ref >59)
Globulin, Total: 2 g/dL (ref 1.5–4.5)
Glucose: 95 mg/dL (ref 65–99)
Potassium: 5.1 mmol/L (ref 3.5–5.2)
Sodium: 143 mmol/L (ref 134–144)
Total Protein: 6.7 g/dL (ref 6.0–8.5)

## 2020-01-05 LAB — CARDIOVASCULAR RISK ASSESSMENT

## 2020-01-05 LAB — LIPID PANEL
Chol/HDL Ratio: 2.9 ratio (ref 0.0–4.4)
Cholesterol, Total: 211 mg/dL — ABNORMAL HIGH (ref 100–199)
HDL: 74 mg/dL (ref >39)
LDL Chol Calc (NIH): 116 mg/dL — ABNORMAL HIGH (ref 0–99)
Triglycerides: 119 mg/dL (ref 0–149)
VLDL Cholesterol Cal: 21 mg/dL (ref 5–40)

## 2020-01-05 LAB — HEMOGLOBIN A1C
Est. average glucose Bld gHb Est-mCnc: 111 mg/dL
Hgb A1c MFr Bld: 5.5 % (ref 4.8–5.6)

## 2020-01-05 LAB — TSH: TSH: 2.69 u[IU]/mL (ref 0.450–4.500)

## 2020-01-05 MED ORDER — ESTRADIOL 10 MCG VA TABS: ORAL_TABLET | VAGINAL | 0 refills | Status: DC

## 2020-01-05 NOTE — Telephone Encounter (Signed)
Reocmmend vagifem. Rx sent. Kc

## 2020-01-05 NOTE — Telephone Encounter (Signed)
Documentation from pharmacy states that premarin is not covered however, estradiol vaginal cream or vagifem is.

## 2020-01-14 ENCOUNTER — Encounter (INDEPENDENT_AMBULATORY_CARE_PROVIDER_SITE_OTHER): Payer: Self-pay | Admitting: Family Medicine

## 2020-01-14 ENCOUNTER — Other Ambulatory Visit: Payer: Self-pay

## 2020-01-14 ENCOUNTER — Ambulatory Visit (INDEPENDENT_AMBULATORY_CARE_PROVIDER_SITE_OTHER): Payer: BC Managed Care – PPO | Admitting: Family Medicine

## 2020-01-14 VITALS — BP 128/81 | HR 60 | Temp 98.4°F | Ht 63.0 in | Wt 180.0 lb

## 2020-01-14 DIAGNOSIS — Z6832 Body mass index (BMI) 32.0-32.9, adult: Secondary | ICD-10-CM

## 2020-01-14 DIAGNOSIS — E8881 Metabolic syndrome: Secondary | ICD-10-CM | POA: Diagnosis not present

## 2020-01-14 DIAGNOSIS — E669 Obesity, unspecified: Secondary | ICD-10-CM

## 2020-01-19 NOTE — Progress Notes (Signed)
Chief Complaint:   OBESITY Alicia Snow is here to discuss her progress with her obesity treatment plan along with follow-up of her obesity related diagnoses. Alicia Snow is on Cat 2 + 100 calories and states she is following her eating plan approximately 75% of the time. Alicia Snow states she is walking 3.5 miles + 2 miles.  Today's visit was #: 7 Starting weight: 202 lbs Starting date: 07/08/2019 Today's weight: 180 lbs Today's date: 01/14/2020 Total lbs lost to date: 22 lbs Total lbs lost since last in-office visit: 0  Interim History: Alicia Snow is ready to get back on plan. She has not had visit since 10-27-19 and is only up 1 lb. Alicia Snow reports overeating during the holidays. She started back on Cat 2 + 100 calorie plan this week. She notes polyphagia as she has just started back on plan.  Subjective:   1. Insulin resistance Alicia Snow has a diagnosis of insulin resistance based on her elevated fasting insulin level >5. Alicia Snow Kitchen Alicia Snow notes increased hunger since resuming plan this week.  Lab Results  Component Value Date   INSULIN 8.4 07/08/2019   Lab Results  Component Value Date   HGBA1C 5.5 01/04/2020      Assessment/Plan:   1. Insulin resistance  Discussed Saxenda and Metformin and she declined.  2. Class 1 obesity with serious comorbidity and body mass index (BMI) of 32.0 to 32.9 in adult, unspecified obesity type  Alicia Snow is currently in the action stage of change. As such, her goal is to continue with weight loss efforts. She has agreed to Cat 2 + 100 calories. Will add lunch and breakfast options, and 400-500 calories 35 grams of protein.  Exercise goals: Add resistance training 2 times a week. Behavioral modification strategies: increasing lean protein intake and decreasing simple carbohydrates.  Alicia Snow has agreed to follow-up with our clinic in 3 weeks.  Objective:   Blood pressure 128/81, pulse 60, temperature 98.4 F (36.9 C), height 5\' 3"  (1.6 m), weight 180 lb (81.6 kg), last  menstrual period 01/08/2006, SpO2 99 %. Body mass index is 31.89 kg/m.  General: Cooperative, alert, well developed, in no acute distress. HEENT: Conjunctivae and lids unremarkable. Cardiovascular: Regular rhythm.  Lungs: Normal work of breathing. Neurologic: No focal deficits.   Lab Results  Component Value Date   CREATININE 1.05 (H) 01/04/2020   BUN 21 01/04/2020   NA 143 01/04/2020   K 5.1 01/04/2020   CL 102 01/04/2020   CO2 22 01/04/2020   Lab Results  Component Value Date   ALT 20 01/04/2020   AST 20 01/04/2020   ALKPHOS 92 01/04/2020   BILITOT 0.4 01/04/2020   Lab Results  Component Value Date   HGBA1C 5.5 01/04/2020   HGBA1C 5.5 07/08/2019   Lab Results  Component Value Date   INSULIN 8.4 07/08/2019   Lab Results  Component Value Date   TSH 2.690 01/04/2020   Lab Results  Component Value Date   CHOL 211 (H) 01/04/2020   HDL 74 01/04/2020   LDLCALC 116 (H) 01/04/2020   TRIG 119 01/04/2020   CHOLHDL 2.9 01/04/2020   Lab Results  Component Value Date   WBC 20.4 (HH) 01/04/2020   HGB 13.3 01/04/2020   HCT 39.4 01/04/2020   MCV 88 01/04/2020   PLT 230 01/04/2020   Lab Results  Component Value Date   FERRITIN 52 07/17/2007      Attestation Statements:   Reviewed by clinician on day of visit: allergies, medications, problem list,  medical history, surgical history, family history, social history, and previous encounter notes.   IPara March, am acting as Location manager for [employer], [place].  I have reviewed the above documentation for accuracy and completeness, and I agree with the above. -  Georgianne Fick, FNP

## 2020-01-20 ENCOUNTER — Encounter (INDEPENDENT_AMBULATORY_CARE_PROVIDER_SITE_OTHER): Payer: Self-pay | Admitting: Family Medicine

## 2020-02-04 ENCOUNTER — Ambulatory Visit (INDEPENDENT_AMBULATORY_CARE_PROVIDER_SITE_OTHER): Payer: BC Managed Care – PPO | Admitting: Family Medicine

## 2020-02-04 ENCOUNTER — Encounter (INDEPENDENT_AMBULATORY_CARE_PROVIDER_SITE_OTHER): Payer: Self-pay | Admitting: Family Medicine

## 2020-02-04 ENCOUNTER — Other Ambulatory Visit: Payer: Self-pay

## 2020-02-04 VITALS — BP 108/66 | HR 63 | Temp 98.0°F | Ht 63.0 in | Wt 179.0 lb

## 2020-02-04 DIAGNOSIS — E8881 Metabolic syndrome: Secondary | ICD-10-CM | POA: Diagnosis not present

## 2020-02-04 DIAGNOSIS — Z6831 Body mass index (BMI) 31.0-31.9, adult: Secondary | ICD-10-CM

## 2020-02-04 DIAGNOSIS — E669 Obesity, unspecified: Secondary | ICD-10-CM

## 2020-02-08 NOTE — Progress Notes (Signed)
Chief Complaint:   OBESITY Alicia Snow is here to discuss her progress with her obesity treatment plan along with follow-up of her obesity related diagnoses. Alicia Snow is on the Category 2 Plan + 100 calories and states she is following her eating plan approximately 50% of the time. Alicia Snow states she is walking for 60+ minutes 3 times per week.  Today's visit was #: 8 Starting weight: 202 lbs Starting date: 07/08/2019 Today's weight: 179 lbs Today's date: 02/04/2020 Total lbs lost to date: 23 Total lbs lost since last in-office visit: 1  Interim History: Alicia Snow notes the weather has kept her cooped up, and she snacked more. She reports struggling to get protein at times. She does note significant hunger. She is down 23 lbs overall. She eats out nightly, but she gets grilled chicken and vegetables.  Subjective:   1. Insulin resistance Alicia Snow notes hunger throughout the day. We discussed metformin and Saxenda. She says that she really does not want to take medication for hunger.   Lab Results  Component Value Date   INSULIN 8.4 07/08/2019   Lab Results  Component Value Date   HGBA1C 5.5 01/04/2020   Assessment/Plan:   1. Insulin resistance Alicia Snow will consider metformin, and will continue to work on weight loss, exercise, and decreasing simple carbohydrates to help decrease the risk of diabetes.  2. Class 1 obesity with serious comorbidity and body mass index (BMI) of 31.0 to 31.9 in adult, unspecified obesity type Alicia Snow is currently in the action stage of change. As such, her goal is to continue with weight loss efforts. She has agreed to the Category 2 Plan + 100 calories.   Exercise goals: As is.  Behavioral modification strategies: increasing lean protein intake, meal planning and cooking strategies and planning for success.  Alicia Snow has agreed to follow-up with our clinic in 3 weeks.   Objective:   Blood pressure 108/66, pulse 63, temperature 98 F (36.7 C), height 5\' 3"  (1.6  m), weight 179 lb (81.2 kg), last menstrual period 01/08/2006, SpO2 99 %. Body mass index is 31.71 kg/m.  General: Cooperative, alert, well developed, in no acute distress. HEENT: Conjunctivae and lids unremarkable. Cardiovascular: Regular rhythm.  Lungs: Normal work of breathing. Neurologic: No focal deficits.   Lab Results  Component Value Date   CREATININE 1.05 (H) 01/04/2020   BUN 21 01/04/2020   NA 143 01/04/2020   K 5.1 01/04/2020   CL 102 01/04/2020   CO2 22 01/04/2020   Lab Results  Component Value Date   ALT 20 01/04/2020   AST 20 01/04/2020   ALKPHOS 92 01/04/2020   BILITOT 0.4 01/04/2020   Lab Results  Component Value Date   HGBA1C 5.5 01/04/2020   HGBA1C 5.5 07/08/2019   Lab Results  Component Value Date   INSULIN 8.4 07/08/2019   Lab Results  Component Value Date   TSH 2.690 01/04/2020   Lab Results  Component Value Date   CHOL 211 (H) 01/04/2020   HDL 74 01/04/2020   LDLCALC 116 (H) 01/04/2020   TRIG 119 01/04/2020   CHOLHDL 2.9 01/04/2020   Lab Results  Component Value Date   WBC 20.4 (HH) 01/04/2020   HGB 13.3 01/04/2020   HCT 39.4 01/04/2020   MCV 88 01/04/2020   PLT 230 01/04/2020   Lab Results  Component Value Date   FERRITIN 52 07/17/2007   Attestation Statements:   Reviewed by clinician on day of visit: allergies, medications, problem list, medical history, surgical  history, family history, social history, and previous encounter notes.   Alicia Snow, am acting as Location manager for [employer], FNP-C.  I have reviewed the above documentation for accuracy and completeness, and I agree with the above. -  Georgianne Fick, FNP

## 2020-02-09 ENCOUNTER — Other Ambulatory Visit: Payer: Self-pay | Admitting: Family Medicine

## 2020-02-09 DIAGNOSIS — E7801 Familial hypercholesterolemia: Secondary | ICD-10-CM

## 2020-02-15 ENCOUNTER — Inpatient Hospital Stay: Payer: BC Managed Care – PPO | Admitting: Hematology & Oncology

## 2020-02-15 ENCOUNTER — Encounter: Payer: Self-pay | Admitting: Hematology & Oncology

## 2020-02-15 ENCOUNTER — Telehealth: Payer: Self-pay | Admitting: *Deleted

## 2020-02-15 ENCOUNTER — Inpatient Hospital Stay: Payer: BC Managed Care – PPO | Attending: Hematology & Oncology

## 2020-02-15 ENCOUNTER — Other Ambulatory Visit: Payer: Self-pay

## 2020-02-15 VITALS — BP 131/69 | HR 65 | Temp 98.7°F | Resp 17 | Wt 183.0 lb

## 2020-02-15 DIAGNOSIS — Z881 Allergy status to other antibiotic agents status: Secondary | ICD-10-CM | POA: Diagnosis not present

## 2020-02-15 DIAGNOSIS — R634 Abnormal weight loss: Secondary | ICD-10-CM | POA: Diagnosis not present

## 2020-02-15 DIAGNOSIS — C9110 Chronic lymphocytic leukemia of B-cell type not having achieved remission: Secondary | ICD-10-CM | POA: Insufficient documentation

## 2020-02-15 DIAGNOSIS — Z79899 Other long term (current) drug therapy: Secondary | ICD-10-CM | POA: Insufficient documentation

## 2020-02-15 LAB — CBC WITH DIFFERENTIAL (CANCER CENTER ONLY)
Abs Immature Granulocytes: 0.04 10*3/uL (ref 0.00–0.07)
Basophils Absolute: 0.1 10*3/uL (ref 0.0–0.1)
Basophils Relative: 1 %
Eosinophils Absolute: 0.1 10*3/uL (ref 0.0–0.5)
Eosinophils Relative: 1 %
HCT: 38.6 % (ref 36.0–46.0)
Hemoglobin: 12.6 g/dL (ref 12.0–15.0)
Immature Granulocytes: 0 %
Lymphocytes Relative: 69 %
Lymphs Abs: 12.2 10*3/uL — ABNORMAL HIGH (ref 0.7–4.0)
MCH: 29.4 pg (ref 26.0–34.0)
MCHC: 32.6 g/dL (ref 30.0–36.0)
MCV: 90.2 fL (ref 80.0–100.0)
Monocytes Absolute: 0.7 10*3/uL (ref 0.1–1.0)
Monocytes Relative: 4 %
Neutro Abs: 4.4 10*3/uL (ref 1.7–7.7)
Neutrophils Relative %: 25 %
Platelet Count: 199 10*3/uL (ref 150–400)
RBC: 4.28 MIL/uL (ref 3.87–5.11)
RDW: 13 % (ref 11.5–15.5)
WBC Count: 17.5 10*3/uL — ABNORMAL HIGH (ref 4.0–10.5)
nRBC: 0 % (ref 0.0–0.2)

## 2020-02-15 LAB — CMP (CANCER CENTER ONLY)
ALT: 16 U/L (ref 0–44)
AST: 18 U/L (ref 15–41)
Albumin: 4.7 g/dL (ref 3.5–5.0)
Alkaline Phosphatase: 78 U/L (ref 38–126)
Anion gap: 9 (ref 5–15)
BUN: 17 mg/dL (ref 8–23)
CO2: 31 mmol/L (ref 22–32)
Calcium: 10 mg/dL (ref 8.9–10.3)
Chloride: 102 mmol/L (ref 98–111)
Creatinine: 1 mg/dL (ref 0.44–1.00)
GFR, Estimated: >60 mL/min (ref >60)
Glucose, Bld: 99 mg/dL (ref 70–99)
Potassium: 4.1 mmol/L (ref 3.5–5.1)
Sodium: 142 mmol/L (ref 135–145)
Total Bilirubin: 0.5 mg/dL (ref 0.3–1.2)
Total Protein: 6.7 g/dL (ref 6.5–8.1)

## 2020-02-15 LAB — SAVE SMEAR(SSMR), FOR PROVIDER SLIDE REVIEW

## 2020-02-15 LAB — LACTATE DEHYDROGENASE: LDH: 194 U/L — ABNORMAL HIGH (ref 98–192)

## 2020-02-15 NOTE — Progress Notes (Signed)
Hematology and Oncology Follow Up Visit  Alicia Snow [id number] Mar 18, 1955 65 y.o. 02/15/2020   Principle Diagnosis:  Stage A CLL  Current Therapy:    Observation     Interim History:  Ms.  Alicia Snow is back for followup.  We actually saw her a year ago.  Her birthday is coming up next week.  She has had no problems since we last saw her.  Is been no problems with respect to the CLL.  We have been following her now for about 13 years.  She has had no issues with fever.  There is no cough or shortness of breath.  She has had no rashes.  She has had no change in bowel or bladder habits.  She has adopted a dog.  She rescued it.  It is a very touching story.  She is such a good woman.  She is working.  She works from home and enjoys this.  She has had no headache.  Overall, her performance status is ECOG 0.  I forgot to mention, that she walks about 10 miles a week.  She really wants to lose more weight.  She has been very dedicated to a weight loss program.    Medications:  Current Outpatient Medications:  .  co-enzyme Q-10 30 MG capsule, Take 200 mg by mouth daily. , Disp: , Rfl:  .  Estradiol (VAGIFEM) 10 MCG TABS vaginal tablet, Insert vaginal tablet daily x 2 weeks, then decrease to one twice weekly., Disp: 18 tablet, Rfl: 0 .  FLUoxetine (PROZAC) 20 MG capsule, Take 1 capsule by mouth once daily, Disp: 90 capsule, Rfl: 3 .  Multiple Vitamins-Minerals (CENTRUM SILVER ULTRA WOMENS) TABS, Take 1 tablet by mouth daily. , Disp: , Rfl:  .  Probiotic Product (ALIGN) 4 MG CAPS, Take 1 capsule by mouth daily. , Disp: , Rfl:  .  rosuvastatin (CRESTOR) 5 MG tablet, TAKE 1 TABLET BY MOUTH AT BEDTIME, Disp: 90 tablet, Rfl: 3 .  VITAMIN D, ERGOCALCIFEROL, PO, Take by mouth daily. 5000IU daily, Disp: , Rfl:  .  Wheat Dextrin (BENEFIBER) POWD, Take by mouth. As needed, Disp: , Rfl:   Allergies:  Allergies  Allergen Reactions  . Nitrofurantoin Hives, Diarrhea and Rash    Past Medical  History, Surgical history, Social history, and Family History were reviewed and updated.  Review of Systems: Review of Systems  Constitutional: Negative.   HENT: Negative.   Eyes: Negative.   Respiratory: Negative.   Cardiovascular: Negative.   Gastrointestinal: Negative.   Genitourinary: Negative.   Musculoskeletal: Negative.   Skin: Negative.   Neurological: Negative.   Endo/Heme/Allergies: Negative.   Psychiatric/Behavioral: Negative.     Physical Exam:  vitals were not taken for this visit.   Physical Exam Vitals reviewed.  HENT:     Head: Normocephalic and atraumatic.  Eyes:     Pupils: Pupils are equal, round, and reactive to light.  Cardiovascular:     Rate and Rhythm: Normal rate and regular rhythm.     Heart sounds: Normal heart sounds.  Pulmonary:     Effort: Pulmonary effort is normal.     Breath sounds: Normal breath sounds.  Abdominal:     General: Bowel sounds are normal.     Palpations: Abdomen is soft.  Musculoskeletal:        General: No tenderness or deformity. Normal range of motion.     Cervical back: Normal range of motion.  Lymphadenopathy:     Cervical: No cervical  adenopathy.  Skin:    General: Skin is warm and dry.     Findings: No erythema or rash.     Comments: On the back of the right side of her neck, she does have a scaly slightly erythematous patch. It measures about 3 x 2 cm.  Neurological:     Mental Status: She is alert and oriented to person, place, and time.  Psychiatric:        Behavior: Behavior normal.        Thought Content: Thought content normal.        Judgment: Judgment normal.    Lab Results  Component Value Date   WBC 17.5 (H) 02/15/2020   HGB 12.6 02/15/2020   HCT 38.6 02/15/2020   MCV 90.2 02/15/2020   PLT 199 02/15/2020     Chemistry      Component Value Date/Time   NA 143 01/04/2020 0822   NA 141 11/07/2015 1130   K 5.1 01/04/2020 0822   K 3.9 11/07/2015 1130   CL 102 01/04/2020 0822   CL 103  08/01/2015 0932   CO2 22 01/04/2020 0822   CO2 22 11/07/2015 1130   BUN 21 01/04/2020 0822   BUN 23.9 11/07/2015 1130   CREATININE 1.05 (H) 01/04/2020 0822   CREATININE 1.08 (H) 03/04/2019 0854   CREATININE 0.9 11/07/2015 1130      Component Value Date/Time   CALCIUM 9.9 01/04/2020 0822   CALCIUM 9.5 11/07/2015 1130   ALKPHOS 92 01/04/2020 0822   ALKPHOS 88 11/07/2015 1130   AST 20 01/04/2020 0822   AST 32 03/04/2019 0854   AST 19 11/07/2015 1130   ALT 20 01/04/2020 0822   ALT 33 03/04/2019 0854   ALT 21 11/07/2015 1130   BILITOT 0.4 01/04/2020 0822   BILITOT 0.8 03/04/2019 0854   BILITOT 0.68 11/07/2015 1130      Impression and Plan: [name] is a 65 year old white female with a CLL. Everything looks wonderful. I do not see any issues with the CLL. We've been following her now for 13 years.  In all honesty, she really has not changed at all.  She still looks as young as when we first saw her 13 years ago.  For right now, we will plan to see her back in 1 year.  She certainly knows that she can come back sooner if she has any problems.  I know she will have a wonderful birthday.    Volanda Napoleon, MD 2/7/20228:05 AM

## 2020-02-15 NOTE — Telephone Encounter (Signed)
Gave patient a calendar of upcoming appts.

## 2020-02-25 ENCOUNTER — Ambulatory Visit (INDEPENDENT_AMBULATORY_CARE_PROVIDER_SITE_OTHER): Payer: BC Managed Care – PPO | Admitting: Family Medicine

## 2020-02-25 DIAGNOSIS — H25013 Cortical age-related cataract, bilateral: Secondary | ICD-10-CM | POA: Diagnosis not present

## 2020-02-25 DIAGNOSIS — H2513 Age-related nuclear cataract, bilateral: Secondary | ICD-10-CM | POA: Diagnosis not present

## 2020-02-25 DIAGNOSIS — H35362 Drusen (degenerative) of macula, left eye: Secondary | ICD-10-CM | POA: Diagnosis not present

## 2020-02-25 DIAGNOSIS — H02831 Dermatochalasis of right upper eyelid: Secondary | ICD-10-CM | POA: Diagnosis not present

## 2020-03-03 ENCOUNTER — Ambulatory Visit (INDEPENDENT_AMBULATORY_CARE_PROVIDER_SITE_OTHER): Payer: BC Managed Care – PPO | Admitting: Family Medicine

## 2020-03-16 ENCOUNTER — Ambulatory Visit (INDEPENDENT_AMBULATORY_CARE_PROVIDER_SITE_OTHER): Payer: BC Managed Care – PPO | Admitting: Family Medicine

## 2020-04-06 ENCOUNTER — Ambulatory Visit (INDEPENDENT_AMBULATORY_CARE_PROVIDER_SITE_OTHER): Payer: BC Managed Care – PPO | Admitting: Family Medicine

## 2020-08-17 ENCOUNTER — Encounter: Payer: Self-pay | Admitting: Gastroenterology

## 2020-08-25 ENCOUNTER — Encounter: Payer: Self-pay | Admitting: Gastroenterology

## 2020-09-30 ENCOUNTER — Ambulatory Visit: Payer: BC Managed Care – PPO

## 2020-09-30 ENCOUNTER — Encounter: Payer: Self-pay | Admitting: Gastroenterology

## 2020-09-30 ENCOUNTER — Other Ambulatory Visit: Payer: Self-pay

## 2020-09-30 VITALS — Ht 63.0 in | Wt 185.0 lb

## 2020-09-30 DIAGNOSIS — Z1211 Encounter for screening for malignant neoplasm of colon: Secondary | ICD-10-CM

## 2020-09-30 MED ORDER — ONDANSETRON HCL 4 MG PO TABS: 4.0000 mg | ORAL_TABLET | ORAL | 0 refills | Status: DC

## 2020-09-30 MED ORDER — PLENVU 140 G PO SOLR: 1.0000 | ORAL | 0 refills | Status: DC

## 2020-09-30 NOTE — Progress Notes (Signed)
       Patient's pre-visit was done today over the phone with the patient   Name,DOB and address verified.   Patient denies any allergies to Eggs and Soy.  Patient denies any problems with anesthesia/sedation. Patient denies taking diet pills or blood thinners.  Denies atrial flutter or atrial fib Denies chronic constipation rarely needs anything for constipation No home Oxygen.   Packet of Prep instructions mailed to patient including a copy of a consent form-pt is aware.  Patient understands to call us back with any questions or concerns.  Patient is aware of our care-partner policy and CXFQH-22 safety protocol.    Pt waffled between wanting Sutabs and being ok with Plenvu.  Discussed both with pt finally agreeing to Plenvu.  RN noted she was happy to change to Sutab, but that nausea could occur as well.  Was concerned r/t nausea.    Bath RN offered Zofran and discussed how to use it.  Pt seemed content at the end of the call.

## 2020-10-04 ENCOUNTER — Other Ambulatory Visit: Payer: Self-pay

## 2020-10-04 ENCOUNTER — Ambulatory Visit (INDEPENDENT_AMBULATORY_CARE_PROVIDER_SITE_OTHER): Payer: BC Managed Care – PPO

## 2020-10-04 DIAGNOSIS — Z23 Encounter for immunization: Secondary | ICD-10-CM | POA: Diagnosis not present

## 2020-10-05 NOTE — Progress Notes (Signed)
   Covid-19 Vaccination Clinic  Name:  Alicia Snow    MRN: [id number] DOB: 10/03/1955  10/05/2020  Alicia Snow was observed post Covid-19 immunization for 15 minutes without incident. She was provided with Vaccine Information Sheet and instruction to access the V-Safe system.   Alicia Snow was instructed to call 911 with any severe reactions post vaccine: Difficulty breathing  Swelling of face and throat  A fast heartbeat  A bad rash all over body  Dizziness and weakness

## 2020-10-14 ENCOUNTER — Other Ambulatory Visit: Payer: Self-pay | Admitting: Radiology

## 2020-10-14 DIAGNOSIS — Z1231 Encounter for screening mammogram for malignant neoplasm of breast: Secondary | ICD-10-CM

## 2020-10-18 ENCOUNTER — Other Ambulatory Visit: Payer: Self-pay

## 2020-10-18 ENCOUNTER — Encounter: Payer: Self-pay | Admitting: Gastroenterology

## 2020-10-18 ENCOUNTER — Ambulatory Visit (AMBULATORY_SURGERY_CENTER): Payer: BC Managed Care – PPO | Admitting: Gastroenterology

## 2020-10-18 VITALS — BP 103/61 | HR 57 | Temp 98.0°F | Resp 14 | Ht 63.0 in | Wt 182.0 lb

## 2020-10-18 DIAGNOSIS — Z86010 Personal history of colonic polyps: Secondary | ICD-10-CM | POA: Diagnosis not present

## 2020-10-18 DIAGNOSIS — Z1211 Encounter for screening for malignant neoplasm of colon: Secondary | ICD-10-CM

## 2020-10-18 MED ORDER — SODIUM CHLORIDE 0.9 % IV SOLN: 500.0000 mL | Freq: Once | INTRAVENOUS | Status: DC

## 2020-10-18 NOTE — Op Note (Addendum)
[facility] Patient Name: Alicia Snow Procedure Date: 10/18/2020 9:56 AM MRN: [id number] Endoscopist: Thornton Park MD, MD Age: 65 Referring MD:  Date of Birth: 1955-04-22 Gender: Female Account #: [id number] Procedure:                Colonoscopy Indications:              Screening for colorectal malignant neoplasm                           No polyps on prior colonoscopy in 1998, 2000, 2005,                            and 2012.                           No known family history of colon cancer or polyps. Medicines:                Monitored Anesthesia Care Procedure:                Pre-Anesthesia Assessment:                           - Prior to the procedure, a History and Physical                            was performed, and patient medications and                            allergies were reviewed. The patient's tolerance of                            previous anesthesia was also reviewed. The risks                            and benefits of the procedure and the sedation                            options and risks were discussed with the patient.                            All questions were answered, and informed consent                            was obtained. Prior Anticoagulants: The patient has                            taken no previous anticoagulant or antiplatelet                            agents. ASA Grade Assessment: II - A patient with                            mild systemic disease. After reviewing the risks  and benefits, the patient was deemed in                            satisfactory condition to undergo the procedure.                           After obtaining informed consent, the colonoscope                            was passed under direct vision. Throughout the                            procedure, the patient's blood pressure, pulse, and                            oxygen saturations were monitored continuously. The                             Olympus CF-HQ190L [id number]) Colonoscope was                            introduced through the anus and advanced to the the                            cecum, identified by appendiceal orifice and                            ileocecal valve. The colonoscopy was performed                            without difficulty. The patient tolerated the                            procedure well. The quality of the bowel                            preparation was excellent. The ileocecal valve,                            appendiceal orifice, and rectum were photographed. Scope In: 10:38:19 AM Scope Out: 10:50:56 AM Scope Withdrawal Time: 0 hours 8 minutes 50 seconds  Total Procedure Duration: 0 hours 12 minutes 37 seconds  Findings:                 The perianal and digital rectal examinations were                            normal except for hemorrhoids.                           Multiple small and large-mouthed diverticula were                            found in the sigmoid colon and distal descending  colon.                           The exam was otherwise without abnormality on                            direct and retroflexion views. Complications:            No immediate complications. Estimated Blood Loss:     Estimated blood loss: none. Impression:               - Diverticulosis in the sigmoid colon and in the                            distal descending colon.                           - The examination was otherwise normal on direct                            and retroflexion views.                           - No specimens collected. Recommendation:           - Patient has a contact number available for                            emergencies. The signs and symptoms of potential                            delayed complications were discussed with the                            patient. Return to normal activities tomorrow.                             Written discharge instructions were provided to the                            patient.                           - Continue present medications.                           - Repeat colonoscopy in 10 years for surveillance,                            earlier with new symptoms.                           - Follow a high fiber diet. Drink at least 64                            ounces of water daily. Add a daily stool bulking  agent such as psyllium (an exampled would be                            Metamucil).                           - Emerging evidence supports eating a diet of                            fruits, vegetables, grains, calcium, and yogurt                            while reducing red meat and alcohol may reduce the                            risk of colon cancer.                           - Thank you for allowing me to be involved in your                            colon cancer prevention. Thornton Park MD, MD 10/18/2020 11:01:27 AM This report has been signed electronically.

## 2020-10-18 NOTE — Progress Notes (Signed)
Report given to PACU, vss 

## 2020-10-18 NOTE — Progress Notes (Signed)
Pt's states no medical or surgical changes since previsit or office visit.   Cw vitals.

## 2020-10-18 NOTE — Progress Notes (Addendum)
   Referring Provider: Rochel Brome, MD Primary Care Physician:  Rochel Brome, MD  Reason for Procedure:  Colon cancer surveillance   IMPRESSION:  Need for colon cancer surveillance Appropriate candidate for monitored anesthesia care  PLAN: Colonoscopy in the [place] today   HPI: Alicia Snow is a 65 y.o. female presents for surveillance colonoscopy.  Colonoscopy in 1998, 2000, 2005 and 2012.  Mild sigmoid diverticulosis.   Normal colon biopsies obtained in 2012 to evaluate for microscopic colitis.  No history of polyps.  Dr. Nichola Sizer notes mention IBS with Align QHS. She also has a history of hemorrhoids with prior banding.    No known family history of colon cancer or polyps. No family history of uterine/endometrial cancer, pancreatic cancer or gastric/stomach cancer.   Past Medical History:  Diagnosis Date   Anal fissure    Anxiety    Back pain    CLL (chronic lymphoblastic leukemia)    Constipation    Depression    Diverticulosis    Hemorrhoids    Hyperlipidemia    IBS (irritable bowel syndrome)    Nephrolithiasis    Obesity     Past Surgical History:  Procedure Laterality Date   ANAL FISSURE REPAIR     excision of polyp and external anal tag     LITHOTRIPSY         Allergies as of 10/18/2020 - Review Complete 10/18/2020  Allergen Reaction Noted   Nitrofurantoin Hives, Diarrhea, and Rash 06/09/2007    Family History  Problem Relation Age of Onset   Breast cancer Mother 80       died of breast cancer   Cancer Mother    Heart disease Father    Hypertension Father    Hyperlipidemia Father    Diabetes Sister        x2   Diabetes Brother    Stroke Brother    Breast cancer Maternal Grandmother 42       died of breast cancer   Liver cancer Maternal Grandfather    Breast cancer Paternal Grandmother        died of breast ca - age unknown   Heart disease Paternal Grandmother    Heart disease Paternal Grandfather    Colon cancer Neg Hx    Esophageal  cancer Neg Hx    Rectal cancer Neg Hx    Colon polyps Neg Hx    Stomach cancer Neg Hx      Physical Exam: General:   Alert,  well-nourished, pleasant and cooperative in NAD Head:  Normocephalic and atraumatic. Eyes:  Sclera clear, no icterus.   Conjunctiva pink. Mouth:  No deformity or lesions.   Neck:  Supple; no masses or thyromegaly. Lungs:  Clear throughout to auscultation.   No wheezes. Heart:  Regular rate and rhythm; no murmurs. Abdomen:  Soft, non-tender, nondistended, normal bowel sounds, no rebound or guarding.  Msk:  Symmetrical. No boney deformities LAD: No inguinal or umbilical LAD Extremities:  No clubbing or edema. Neurologic:  Alert and  oriented x4;  grossly nonfocal Skin:  No obvious rash or bruise. Psych:  Alert and cooperative. Normal mood and affect.     Shalla Bulluck L. Tarri Glenn, MD, MPH 10/18/2020, 9:55 AM

## 2020-10-18 NOTE — Patient Instructions (Signed)
Handouts Provided:  Diverticulosis  YOU HAD AN ENDOSCOPIC PROCEDURE TODAY AT [facility]:   Refer to the procedure report that was given to you for any specific questions about what was found during the examination.  If the procedure report does not answer your questions, please call your gastroenterologist to clarify.  If you requested that your care partner not be given the details of your procedure findings, then the procedure report has been included in a sealed envelope for you to review at your convenience later.  YOU SHOULD EXPECT: Some feelings of bloating in the abdomen. Passage of more gas than usual.  Walking can help get rid of the air that was put into your GI tract during the procedure and reduce the bloating. If you had a lower endoscopy (such as a colonoscopy or flexible sigmoidoscopy) you may notice spotting of blood in your stool or on the toilet paper. If you underwent a bowel prep for your procedure, you may not have a normal bowel movement for a few days.  Please Note:  You might notice some irritation and congestion in your nose or some drainage.  This is from the oxygen used during your procedure.  There is no need for concern and it should clear up in a day or so.  SYMPTOMS TO REPORT IMMEDIATELY:  Following lower endoscopy (colonoscopy or flexible sigmoidoscopy):  Excessive amounts of blood in the stool  Significant tenderness or worsening of abdominal pains  Swelling of the abdomen that is new, acute  Fever of 100F or higher  For urgent or emergent issues, a gastroenterologist can be reached at any hour by calling [phone]. Do not use MyChart messaging for urgent concerns.    DIET:  We do recommend a small meal at first, but then you may proceed to your regular diet.  Drink plenty of fluids but you should avoid alcoholic beverages for 24 hours.  ACTIVITY:  You should plan to take it easy for the rest of today and you should NOT DRIVE or use heavy  machinery until tomorrow (because of the sedation medicines used during the test).    FOLLOW UP: Our staff will call the number listed on your records 48-72 hours following your procedure to check on you and address any questions or concerns that you may have regarding the information given to you following your procedure. If we do not reach you, we will leave a message.  We will attempt to reach you two times.  During this call, we will ask if you have developed any symptoms of COVID 19. If you develop any symptoms (ie: fever, flu-like symptoms, shortness of breath, cough etc.) before then, please call [phone].  If you test positive for Covid 19 in the 2 weeks post procedure, please call and report this information to [place].    If any biopsies were taken you will be contacted by phone or by letter within the next 1-3 weeks.  Please call us at [phone] if you have not heard about the biopsies in 3 weeks.    SIGNATURES/CONFIDENTIALITY: You and/or your care partner have signed paperwork which will be entered into your electronic medical record.  These signatures attest to the fact that that the information above on your After Visit Summary has been reviewed and is understood.  Full responsibility of the confidentiality of this discharge information lies with you and/or your care-partner.

## 2020-10-20 ENCOUNTER — Telehealth: Payer: Self-pay | Admitting: *Deleted

## 2020-10-20 NOTE — Telephone Encounter (Signed)
  Follow up Call-  Call back number 10/18/2020  Post procedure Call Back phone  # [phone]  Some recent data might be hidden     Patient questions:  Do you have a fever, pain , or abdominal swelling? No. Pain Score  0 *  Have you tolerated food without any problems? Yes.    Have you been able to return to your normal activities? Yes.    Do you have any questions about your discharge instructions: Diet   No. Medications  No. Follow up visit  No.  Do you have questions or concerns about your Care? No.  Actions: * If pain score is 4 or above: No action needed, pain <4.  Have you developed a fever since your procedure? no  2.   Have you had an respiratory symptoms (SOB or cough) since your procedure? no  3.   Have you tested positive for COVID 19 since your procedure no  4.   Have you had any family members/close contacts diagnosed with the COVID 19 since your procedure?  no   If yes to any of these questions please route to Joylene John, RN and Joella Prince, RN

## 2020-11-15 ENCOUNTER — Ambulatory Visit
Admission: RE | Admit: 2020-11-15 | Discharge: 2020-11-15 | Disposition: A | Discharge status: Home / Self Care | Payer: BC Managed Care – PPO | Source: Ambulatory Visit | Attending: Family Medicine | Admitting: Family Medicine

## 2020-11-15 ENCOUNTER — Other Ambulatory Visit: Payer: Self-pay

## 2020-11-15 DIAGNOSIS — Z1231 Encounter for screening mammogram for malignant neoplasm of breast: Secondary | ICD-10-CM | POA: Diagnosis not present

## 2020-12-19 DIAGNOSIS — Z01419 Encounter for gynecological examination (general) (routine) without abnormal findings: Secondary | ICD-10-CM | POA: Diagnosis not present

## 2021-01-04 NOTE — Progress Notes (Signed)
Subjective:  Patient ID: Alicia Snow, female    DOB: Dec 10, 1955  Age: 65 y.o. MRN: [id number]  Chief Complaint  Patient presents with   Physical    HPI Well Adult Physical: Patient here for a comprehensive physical exam.The patient reports no problems Do you take any herbs or supplements that were not prescribed by a doctor? no Are you taking calcium supplements? no Are you taking aspirin daily? no  Encounter for general adult medical examination without abnormal findings  Physical ("At Risk" items are starred): Patient's last physical exam was 1 year ago .  Patient is not afflicted from Stress Incontinence and Urge Incontinence  Patient wears a seat belt, has smoke detectors, has carbon monoxide detectors, practices appropriate gun safety, and wears sunscreen with extended sun exposure. Dental Care: biannual cleanings, brushes and flosses daily. Ophthalmology/Optometry: Annual visit.  Hearing loss: none Vision impairments: none  Menopausal Self breast exams: yes  [facility] Office Visit from 01/05/2021 in [place]  PHQ-2 Total Score 0        Social Hx   Social History   Socioeconomic History   Marital status: Married    Spouse name: Not on file   Number of children: 0   Years of education: Not on file   Highest education level: Not on file  Occupational History   Occupation: para legal  Tobacco Use   Smoking status: Never   Smokeless tobacco: Never   Tobacco comments:    never used tobacco  Vaping Use   Vaping Use: Never used  Substance and Sexual Activity   Alcohol use: No    Alcohol/week: 0.0 standard drinks   Drug use: No   Sexual activity: Not on file  Other Topics Concern   Not on file  Social History Narrative   Not on file   Social Determinants of Health   Financial Resource Strain: Low Risk    Difficulty of Paying Living Expenses: Not hard at all  Food Insecurity: Not on file  Transportation Needs: Unknown   Lack of  Transportation (Medical): No   Lack of Transportation (Non-Medical): Not on file  Physical Activity: Not on file  Stress: No Stress Concern Present   Feeling of Stress : Not at all  Social Connections: Moderately Isolated   Frequency of Communication with Friends and Family: Three times a week   Frequency of Social Gatherings with Friends and Family: Three times a week   Attends Religious Services: Never   Active Member of Clubs or Organizations: No   Attends Music therapist: Never   Marital Status: Married   Past Medical History:  Diagnosis Date   Anal fissure    Anxiety    Back pain    CLL (chronic lymphoblastic leukemia)    Constipation    Depression    Diverticulosis    Hemorrhoids    Hyperlipidemia    IBS (irritable bowel syndrome)    Nephrolithiasis    Obesity    Past Surgical History:  Procedure Laterality Date   ANAL FISSURE REPAIR     excision of polyp and external anal tag     LITHOTRIPSY      Family History  Problem Relation Age of Onset   Breast cancer Mother 8       died of breast cancer   Cancer Mother    Heart disease Father    Hypertension Father    Hyperlipidemia Father    Diabetes Sister  x2   Diabetes Brother    Stroke Brother    Breast cancer Maternal Grandmother April 04, 2040       died of breast cancer   Liver cancer Maternal Grandfather    Breast cancer Paternal Grandmother        died of breast ca - age unknown   Heart disease Paternal Grandmother    Heart disease Paternal Grandfather    Colon cancer Neg Hx    Esophageal cancer Neg Hx    Rectal cancer Neg Hx    Colon polyps Neg Hx    Stomach cancer Neg Hx     Review of Systems  Constitutional:  Negative for appetite change, fatigue and fever.  HENT:  Negative for congestion, ear pain, sinus pressure and sore throat.   Eyes:  Negative for pain.  Respiratory:  Negative for cough, chest tightness, shortness of breath and wheezing.   Cardiovascular:  Negative for chest  pain and palpitations.  Gastrointestinal:  Negative for abdominal pain, constipation, diarrhea, nausea and vomiting.  Genitourinary:  Negative for dysuria and hematuria.  Musculoskeletal:  Negative for arthralgias, back pain, joint swelling and myalgias.  Skin:  Negative for rash.  Neurological:  Negative for dizziness, weakness and headaches.  Psychiatric/Behavioral:  Negative for dysphoric mood. The patient is not nervous/anxious.     Objective:  BP 134/68 (BP Location: Right Arm, Patient Position: Sitting)    Pulse 68    Temp 97.8 F (36.6 C) (Temporal)    Ht 5\' 3"  (1.6 m)    Wt 199 lb (90.3 kg)    LMP 01/08/2006    SpO2 98%    BMI 35.25 kg/m   BP/Weight 01/05/2021 10/18/2020 09/23/3844  Systolic BP 659 935 -  Diastolic BP 68 61 -  Wt. (Lbs) 199 182 185  BMI 35.25 32.24 32.77    Physical Exam Vitals reviewed.  Constitutional:      General: She is not in acute distress.    Appearance: Normal appearance. She is normal weight.  HENT:     Right Ear: Tympanic membrane and ear canal normal.     Left Ear: Tympanic membrane and ear canal normal.     Nose: Nose normal. No congestion or rhinorrhea.  Eyes:     Conjunctiva/sclera: Conjunctivae normal.  Neck:     Thyroid: No thyroid mass.     Vascular: No carotid bruit.  Cardiovascular:     Rate and Rhythm: Normal rate and regular rhythm.     Pulses: Normal pulses.     Heart sounds: Normal heart sounds. No murmur heard. Pulmonary:     Effort: Pulmonary effort is normal. No respiratory distress.     Breath sounds: Normal breath sounds.  Abdominal:     General: Abdomen is flat. Bowel sounds are normal.     Palpations: Abdomen is soft. There is no mass.     Tenderness: There is no abdominal tenderness.  Musculoskeletal:        General: Normal range of motion.  Lymphadenopathy:     Cervical: No cervical adenopathy.  Skin:    General: Skin is warm and dry.  Neurological:     Mental Status: She is alert and oriented to person,  place, and time.     Cranial Nerves: No cranial nerve deficit.  Psychiatric:        Mood and Affect: Mood normal.        Behavior: Behavior normal.    Lab Results  Component Value Date   WBC 17.9 (  H) 01/05/2021   HGB 13.2 01/05/2021   HCT 39.6 01/05/2021   PLT 220 01/05/2021   GLUCOSE 99 01/05/2021   CHOL 189 01/05/2021   TRIG 86 01/05/2021   HDL 71 01/05/2021   LDLCALC 102 (H) 01/05/2021   ALT 21 01/05/2021   AST 24 01/05/2021   NA 141 01/05/2021   K 4.9 01/05/2021   CL 103 01/05/2021   CREATININE 0.96 01/05/2021   BUN 26 01/05/2021   CO2 24 01/05/2021   TSH 2.120 01/05/2021   HGBA1C 5.5 01/04/2020      Assessment & Plan:   Problem List Items Addressed This Visit       Genitourinary   Asymptomatic microscopic hematuria    Refer to urology if urine culture was negative.       Relevant Orders   POCT URINALYSIS DIP (CLINITEK) (Completed)   Urine Culture (Completed)     Other   Chronic lymphocytic leukemia ([place])    Mgmt by oncology      Encounter for general adult medical examination without abnormal findings - Primary    Things to do to keep yourself healthy  - Exercise at least 30-45 minutes a day, 3-4 days a week.  - Eat a low-fat diet with lots of fruits and vegetables, up to 7-9 servings per day.  - Seatbelts can save your life. Wear them always.  - Smoke detectors on every level of your home, check batteries every year.  - Eye Doctor - have an eye exam every 1-2 years  - Safe sex - if you may be exposed to STDs, use a condom.  - Alcohol -  If you drink, do it moderately, less than 2 drinks per day.  - [place]. Choose someone to speak for you if you are not able.  - Depression is common in our stressful world.If you're feeling down or losing interest in things you normally enjoy, please come in for a visit.  - Violence - If anyone is threatening or hurting you, please call immediately.        Relevant Orders   CBC with  Differential/Platelet (Completed)   Comprehensive metabolic panel (Completed)   Lipid panel (Completed)   TSH (Completed)   POCT URINALYSIS DIP (CLINITEK) (Completed)      Body mass index is 35.25 kg/m.   This is a list of the screening recommended for you and due dates:  Health Maintenance  Topic Date Due   HIV Screening  Never done   Hepatitis C Screening: USPSTF Recommendation to screen - Ages 31-79 yo.  Never done   Pap Smear  Never done   Pneumococcal Vaccination (3 - PPSV23 if available, else PCV20) 03/22/2018   Mammogram  11/16/2022   Tetanus Vaccine  03/22/2027   Colon Cancer Screening  10/19/2030   Flu Shot  Completed   COVID-19 Vaccine  Completed   Zoster (Shingles) Vaccine  Completed   HPV Vaccine  Aged Out     Follow-up: Return in about 6 months (around 07/06/2021) for chronic fasting.  An After Visit Summary was printed and given to the patient.   I,Lauren M Auman,acting as a scribe for Rochel Brome, MD.,have documented all relevant documentation on the behalf of Rochel Brome, MD,as directed by  Rochel Brome, MD while in the presence of Rochel Brome, MD.   Rochel Brome, MD [place] [phone]

## 2021-01-05 ENCOUNTER — Ambulatory Visit (INDEPENDENT_AMBULATORY_CARE_PROVIDER_SITE_OTHER): Payer: BC Managed Care – PPO | Admitting: Family Medicine

## 2021-01-05 ENCOUNTER — Encounter: Payer: Self-pay | Admitting: Family Medicine

## 2021-01-05 ENCOUNTER — Other Ambulatory Visit: Payer: Self-pay

## 2021-01-05 VITALS — BP 134/68 | HR 68 | Temp 97.8°F | Ht 63.0 in | Wt 199.0 lb

## 2021-01-05 DIAGNOSIS — R3121 Asymptomatic microscopic hematuria: Secondary | ICD-10-CM | POA: Insufficient documentation

## 2021-01-05 DIAGNOSIS — C9110 Chronic lymphocytic leukemia of B-cell type not having achieved remission: Secondary | ICD-10-CM

## 2021-01-05 DIAGNOSIS — Z0000 Encounter for general adult medical examination without abnormal findings: Secondary | ICD-10-CM | POA: Diagnosis not present

## 2021-01-05 HISTORY — DX: Asymptomatic microscopic hematuria: R31.21

## 2021-01-05 LAB — POCT URINALYSIS DIP (CLINITEK)
Bilirubin, UA: NEGATIVE
Glucose, UA: NEGATIVE mg/dL
Ketones, POC UA: NEGATIVE mg/dL
Leukocytes, UA: NEGATIVE
Nitrite, UA: NEGATIVE
POC PROTEIN,UA: NEGATIVE
Spec Grav, UA: 1.025 (ref 1.010–1.025)
Urobilinogen, UA: NEGATIVE E.U./dL — AB
pH, UA: 6 (ref 5.0–8.0)

## 2021-01-05 NOTE — Patient Instructions (Addendum)
Things to do to keep yourself healthy  - Exercise at least 30-45 minutes a day, 3-4 days a week.  - Eat a low-fat diet with lots of fruits and vegetables, up to 7-9 servings per day.  - Seatbelts can save your life. Wear them always.  - Smoke detectors on every level of your home, check batteries every year.  - Eye Doctor - have an eye exam every 1-2 years  - Alcohol -  If you drink, do it moderately, less than 2 drinks per day.  - [place]. Pleae bring a copy. - Depression is common in our stressful world.If you're feeling down or losing interest in things you normally enjoy, please come in for a visit.  - Violence - If anyone is threatening or hurting you, please call immediately.  Plan to refer to urology if urine culture negative.

## 2021-01-06 LAB — COMPREHENSIVE METABOLIC PANEL
ALT: 21 IU/L (ref 0–32)
AST: 24 IU/L (ref 0–40)
Albumin/Globulin Ratio: 2.7 — ABNORMAL HIGH (ref 1.2–2.2)
Albumin: 4.9 g/dL — ABNORMAL HIGH (ref 3.8–4.8)
Alkaline Phosphatase: 90 IU/L (ref 44–121)
BUN/Creatinine Ratio: 27 (ref 12–28)
BUN: 26 mg/dL (ref 8–27)
Bilirubin Total: 0.5 mg/dL (ref 0.0–1.2)
CO2: 24 mmol/L (ref 20–29)
Calcium: 10 mg/dL (ref 8.7–10.3)
Chloride: 103 mmol/L (ref 96–106)
Creatinine, Ser: 0.96 mg/dL (ref 0.57–1.00)
Globulin, Total: 1.8 g/dL (ref 1.5–4.5)
Glucose: 99 mg/dL (ref 70–99)
Potassium: 4.9 mmol/L (ref 3.5–5.2)
Sodium: 141 mmol/L (ref 134–144)
Total Protein: 6.7 g/dL (ref 6.0–8.5)
eGFR: 66 mL/min/{1.73_m2} (ref >59)

## 2021-01-06 LAB — LIPID PANEL
Chol/HDL Ratio: 2.7 ratio (ref 0.0–4.4)
Cholesterol, Total: 189 mg/dL (ref 100–199)
HDL: 71 mg/dL (ref >39)
LDL Chol Calc (NIH): 102 mg/dL — ABNORMAL HIGH (ref 0–99)
Triglycerides: 86 mg/dL (ref 0–149)
VLDL Cholesterol Cal: 16 mg/dL (ref 5–40)

## 2021-01-06 LAB — CBC WITH DIFFERENTIAL/PLATELET
Basophils Absolute: 0.1 10*3/uL (ref 0.0–0.2)
Basos: 1 %
EOS (ABSOLUTE): 0.2 10*3/uL (ref 0.0–0.4)
Eos: 1 %
Hematocrit: 39.6 % (ref 34.0–46.6)
Hemoglobin: 13.2 g/dL (ref 11.1–15.9)
Immature Grans (Abs): 0 10*3/uL (ref 0.0–0.1)
Immature Granulocytes: 0 %
Lymphocytes Absolute: 13 10*3/uL — ABNORMAL HIGH (ref 0.7–3.1)
Lymphs: 72 %
MCH: 29.5 pg (ref 26.6–33.0)
MCHC: 33.3 g/dL (ref 31.5–35.7)
MCV: 89 fL (ref 79–97)
Monocytes Absolute: 0.7 10*3/uL (ref 0.1–0.9)
Monocytes: 4 %
Neutrophils Absolute: 3.9 10*3/uL (ref 1.4–7.0)
Neutrophils: 22 %
Platelets: 220 10*3/uL (ref 150–450)
RBC: 4.47 x10E6/uL (ref 3.77–5.28)
RDW: 12.8 % (ref 11.7–15.4)
WBC: 17.9 10*3/uL — ABNORMAL HIGH (ref 3.4–10.8)

## 2021-01-06 LAB — TSH: TSH: 2.12 u[IU]/mL (ref 0.450–4.500)

## 2021-01-06 NOTE — Progress Notes (Signed)
Blood count abnormal but improved. Pt has CLL. Liver function normal.  Kidney function normal.  Thyroid function normal.  Cholesterol:good

## 2021-01-08 LAB — URINE CULTURE

## 2021-01-10 ENCOUNTER — Other Ambulatory Visit: Payer: Self-pay

## 2021-01-10 DIAGNOSIS — R3121 Asymptomatic microscopic hematuria: Secondary | ICD-10-CM

## 2021-01-22 NOTE — Assessment & Plan Note (Signed)
Refer to urology if urine culture was negative.

## 2021-01-22 NOTE — Assessment & Plan Note (Signed)
Mgmt by oncology

## 2021-01-22 NOTE — Assessment & Plan Note (Signed)

## 2021-02-14 ENCOUNTER — Inpatient Hospital Stay: Payer: BC Managed Care – PPO | Attending: Hematology & Oncology

## 2021-02-14 ENCOUNTER — Other Ambulatory Visit: Payer: Self-pay

## 2021-02-14 ENCOUNTER — Encounter: Payer: Self-pay | Admitting: Hematology & Oncology

## 2021-02-14 ENCOUNTER — Inpatient Hospital Stay: Payer: BC Managed Care – PPO | Admitting: Hematology & Oncology

## 2021-02-14 VITALS — BP 142/73 | HR 77 | Temp 98.5°F | Resp 18 | Ht 62.0 in | Wt 200.0 lb

## 2021-02-14 DIAGNOSIS — Z79899 Other long term (current) drug therapy: Secondary | ICD-10-CM | POA: Insufficient documentation

## 2021-02-14 DIAGNOSIS — C9110 Chronic lymphocytic leukemia of B-cell type not having achieved remission: Secondary | ICD-10-CM | POA: Diagnosis not present

## 2021-02-14 DIAGNOSIS — R3129 Other microscopic hematuria: Secondary | ICD-10-CM | POA: Diagnosis not present

## 2021-02-14 DIAGNOSIS — Z881 Allergy status to other antibiotic agents status: Secondary | ICD-10-CM | POA: Diagnosis not present

## 2021-02-14 LAB — CBC WITH DIFFERENTIAL (CANCER CENTER ONLY)
Abs Immature Granulocytes: 0.04 10*3/uL (ref 0.00–0.07)
Basophils Absolute: 0.1 10*3/uL (ref 0.0–0.1)
Basophils Relative: 1 %
Eosinophils Absolute: 0.1 10*3/uL (ref 0.0–0.5)
Eosinophils Relative: 1 %
HCT: 39.3 % (ref 36.0–46.0)
Hemoglobin: 13 g/dL (ref 12.0–15.0)
Immature Granulocytes: 0 %
Lymphocytes Relative: 74 %
Lymphs Abs: 14.2 10*3/uL — ABNORMAL HIGH (ref 0.7–4.0)
MCH: 29.7 pg (ref 26.0–34.0)
MCHC: 33.1 g/dL (ref 30.0–36.0)
MCV: 89.9 fL (ref 80.0–100.0)
Monocytes Absolute: 0.6 10*3/uL (ref 0.1–1.0)
Monocytes Relative: 3 %
Neutro Abs: 3.9 10*3/uL (ref 1.7–7.7)
Neutrophils Relative %: 21 %
Platelet Count: 219 10*3/uL (ref 150–400)
RBC: 4.37 MIL/uL (ref 3.87–5.11)
RDW: 12.6 % (ref 11.5–15.5)
WBC Count: 19 10*3/uL — ABNORMAL HIGH (ref 4.0–10.5)
nRBC: 0 % (ref 0.0–0.2)

## 2021-02-14 LAB — CMP (CANCER CENTER ONLY)
ALT: 16 U/L (ref 0–44)
AST: 19 U/L (ref 15–41)
Albumin: 4.7 g/dL (ref 3.5–5.0)
Alkaline Phosphatase: 78 U/L (ref 38–126)
Anion gap: 10 (ref 5–15)
BUN: 21 mg/dL (ref 8–23)
CO2: 26 mmol/L (ref 22–32)
Calcium: 9.6 mg/dL (ref 8.9–10.3)
Chloride: 103 mmol/L (ref 98–111)
Creatinine: 0.98 mg/dL (ref 0.44–1.00)
GFR, Estimated: >60 mL/min (ref >60)
Glucose, Bld: 99 mg/dL (ref 70–99)
Potassium: 3.9 mmol/L (ref 3.5–5.1)
Sodium: 139 mmol/L (ref 135–145)
Total Bilirubin: 0.6 mg/dL (ref 0.3–1.2)
Total Protein: 6.6 g/dL (ref 6.5–8.1)

## 2021-02-14 LAB — SAVE SMEAR(SSMR), FOR PROVIDER SLIDE REVIEW

## 2021-02-14 LAB — LACTATE DEHYDROGENASE: LDH: 180 U/L (ref 98–192)

## 2021-02-14 NOTE — Progress Notes (Signed)
Hematology and Oncology Follow Up Visit  Alicia Snow [id number] Jul 18, 1955 66 y.o. 02/14/2021   Principle Diagnosis:  Stage A CLL  Current Therapy:   Observation     Interim History:  Ms.  Snow is back for followup.  We actually saw her a year ago.  Her birthday is coming up next week.  She has a [birth year]'s Day birthday.  She will be 66 years old.  She is under limited stress right now.  This is because of health issues with her family members.  Her sister was diagnosed with lymphoma.  She is taking chemotherapy down in [place].  A brother and has a lot of health issues.  He underwent amputation of his right leg.  Hopefully will not need his left leg amputated.  I think another sister also is having some health issues.  She is still trying to stay active.  She is gained a little bit of weight because she has been not able to walk is much as she would like.  She is still working.  She really enjoys work.  We have been seeing her now for about 14 years.  We have not had to give her any treatment at all for her CLL.  She has had no problems with COVID.  She has had no issues with cough.  There is no headache.  She has had no rashes.  She has had no change in bowel or bladder habits.  She does have to see urology because of microscopic hematuria..  She had a colonoscopy recently because of diverticulosis.  Overall, her performance status is ECOG 0.  Medications:  Current Outpatient Medications:    co-enzyme Q-10 30 MG capsule, Take 200 mg by mouth daily. , Disp: , Rfl:    FLUoxetine (PROZAC) 20 MG capsule, Take 1 capsule by mouth once daily, Disp: 90 capsule, Rfl: 3   Multiple Vitamins-Minerals (CENTRUM SILVER ULTRA WOMENS) TABS, Take 1 tablet by mouth daily. , Disp: , Rfl:    Probiotic Product (ALIGN) 4 MG CAPS, Take 1 capsule by mouth daily. , Disp: , Rfl:    rosuvastatin (CRESTOR) 5 MG tablet, TAKE 1 TABLET BY MOUTH AT BEDTIME, Disp: 90 tablet, Rfl: 3   VITAMIN D, ERGOCALCIFEROL,  PO, Take by mouth daily. 5000IU daily, Disp: , Rfl:   Allergies:  Allergies  Allergen Reactions   Nitrofurantoin Hives, Diarrhea and Rash    Past Medical History, Surgical history, Social history, and Family History were reviewed and updated.  Review of Systems: Review of Systems  Constitutional: Negative.   HENT: Negative.    Eyes: Negative.   Respiratory: Negative.    Cardiovascular: Negative.   Gastrointestinal: Negative.   Genitourinary: Negative.   Musculoskeletal: Negative.   Skin: Negative.   Neurological: Negative.   Endo/Heme/Allergies: Negative.   Psychiatric/Behavioral: Negative.     Physical Exam:  height is 5\' 2"  (1.575 m) and weight is 200 lb (90.7 kg). Her oral temperature is 98.5 F (36.9 C). Her blood pressure is 142/73 (abnormal) and her pulse is 77. Her respiration is 18 and oxygen saturation is 98%.   Physical Exam Vitals reviewed.  HENT:     Head: Normocephalic and atraumatic.  Eyes:     Pupils: Pupils are equal, round, and reactive to light.  Cardiovascular:     Rate and Rhythm: Normal rate and regular rhythm.     Heart sounds: Normal heart sounds.  Pulmonary:     Effort: Pulmonary effort is normal.  Breath sounds: Normal breath sounds.  Abdominal:     General: Bowel sounds are normal.     Palpations: Abdomen is soft.  Musculoskeletal:        General: No tenderness or deformity. Normal range of motion.     Cervical back: Normal range of motion.  Lymphadenopathy:     Cervical: No cervical adenopathy.  Skin:    General: Skin is warm and dry.     Findings: No erythema or rash.  Neurological:     Mental Status: She is alert and oriented to person, place, and time.  Psychiatric:        Behavior: Behavior normal.        Thought Content: Thought content normal.        Judgment: Judgment normal.   Lab Results  Component Value Date   WBC 19.0 (H) 02/14/2021   HGB 13.0 02/14/2021   HCT 39.3 02/14/2021   MCV 89.9 02/14/2021   PLT 219  02/14/2021     Chemistry      Component Value Date/Time   NA 139 02/14/2021 0825   NA 141 01/05/2021 0846   NA 141 11/07/2015 1130   K 3.9 02/14/2021 0825   K 3.9 11/07/2015 1130   CL 103 02/14/2021 0825   CL 103 08/01/2015 0932   CO2 26 02/14/2021 0825   CO2 22 11/07/2015 1130   BUN 21 02/14/2021 0825   BUN 26 01/05/2021 0846   BUN 23.9 11/07/2015 1130   CREATININE 0.98 02/14/2021 0825   CREATININE 0.9 11/07/2015 1130      Component Value Date/Time   CALCIUM 9.6 02/14/2021 0825   CALCIUM 9.5 11/07/2015 1130   ALKPHOS 78 02/14/2021 0825   ALKPHOS 88 11/07/2015 1130   AST 19 02/14/2021 0825   AST 19 11/07/2015 1130   ALT 16 02/14/2021 0825   ALT 21 11/07/2015 1130   BILITOT 0.6 02/14/2021 0825   BILITOT 0.68 11/07/2015 1130      Impression and Plan: Alicia Snow is a 66 year old white female with a CLL. Everything looks wonderful. I do not see any issues with the CLL. We've been following her now for 14 years.  In all honesty, she really has not changed at all.  She still looks as young as when we first saw her 14 years ago.  I am so happy that she can help out her family.  I am glad that she has been healthy enough to be able to do this.  Again, it is [place] no indication that we have to treat the CLL.  We will plan to get her back in another year.  Volanda Napoleon, MD 2/7/20239:30 AM

## 2021-02-24 DIAGNOSIS — H04123 Dry eye syndrome of bilateral lacrimal glands: Secondary | ICD-10-CM | POA: Diagnosis not present

## 2021-02-24 DIAGNOSIS — H35362 Drusen (degenerative) of macula, left eye: Secondary | ICD-10-CM | POA: Diagnosis not present

## 2021-02-24 DIAGNOSIS — H02831 Dermatochalasis of right upper eyelid: Secondary | ICD-10-CM | POA: Diagnosis not present

## 2021-02-24 DIAGNOSIS — H25813 Combined forms of age-related cataract, bilateral: Secondary | ICD-10-CM | POA: Diagnosis not present

## 2021-02-24 LAB — HM DIABETES EYE EXAM

## 2021-02-28 DIAGNOSIS — Z79899 Other long term (current) drug therapy: Secondary | ICD-10-CM | POA: Diagnosis not present

## 2021-02-28 DIAGNOSIS — N952 Postmenopausal atrophic vaginitis: Secondary | ICD-10-CM | POA: Diagnosis not present

## 2021-02-28 DIAGNOSIS — R3129 Other microscopic hematuria: Secondary | ICD-10-CM | POA: Diagnosis not present

## 2021-03-16 DIAGNOSIS — R3129 Other microscopic hematuria: Secondary | ICD-10-CM | POA: Diagnosis not present

## 2021-03-16 DIAGNOSIS — N952 Postmenopausal atrophic vaginitis: Secondary | ICD-10-CM | POA: Diagnosis not present

## 2021-03-21 DIAGNOSIS — R3129 Other microscopic hematuria: Secondary | ICD-10-CM | POA: Diagnosis not present

## 2021-03-21 DIAGNOSIS — R319 Hematuria, unspecified: Secondary | ICD-10-CM | POA: Diagnosis not present

## 2021-03-21 DIAGNOSIS — K5730 Diverticulosis of large intestine without perforation or abscess without bleeding: Secondary | ICD-10-CM | POA: Diagnosis not present

## 2021-04-03 DIAGNOSIS — N952 Postmenopausal atrophic vaginitis: Secondary | ICD-10-CM | POA: Diagnosis not present

## 2021-04-03 DIAGNOSIS — R3129 Other microscopic hematuria: Secondary | ICD-10-CM | POA: Diagnosis not present

## 2021-04-05 ENCOUNTER — Other Ambulatory Visit: Payer: Self-pay | Admitting: Family Medicine

## 2021-04-05 DIAGNOSIS — E7801 Familial hypercholesterolemia: Secondary | ICD-10-CM

## 2021-08-16 ENCOUNTER — Encounter (INDEPENDENT_AMBULATORY_CARE_PROVIDER_SITE_OTHER): Payer: Self-pay

## 2021-08-23 ENCOUNTER — Other Ambulatory Visit: Payer: Self-pay | Admitting: Family Medicine

## 2021-08-23 DIAGNOSIS — E7801 Familial hypercholesterolemia: Secondary | ICD-10-CM

## 2021-10-04 ENCOUNTER — Other Ambulatory Visit: Payer: Self-pay | Admitting: Family Medicine

## 2021-10-04 DIAGNOSIS — Z1231 Encounter for screening mammogram for malignant neoplasm of breast: Secondary | ICD-10-CM

## 2021-10-17 ENCOUNTER — Ambulatory Visit (INDEPENDENT_AMBULATORY_CARE_PROVIDER_SITE_OTHER): Payer: BC Managed Care – PPO

## 2021-10-17 DIAGNOSIS — Z23 Encounter for immunization: Secondary | ICD-10-CM | POA: Diagnosis not present

## 2021-11-21 ENCOUNTER — Ambulatory Visit
Admission: RE | Admit: 2021-11-21 | Discharge: 2021-11-21 | Disposition: A | Discharge status: Home / Self Care | Payer: BC Managed Care – PPO | Source: Ambulatory Visit | Attending: Family Medicine | Admitting: Family Medicine

## 2021-11-21 DIAGNOSIS — Z1231 Encounter for screening mammogram for malignant neoplasm of breast: Secondary | ICD-10-CM

## 2021-11-21 DIAGNOSIS — L218 Other seborrheic dermatitis: Secondary | ICD-10-CM | POA: Diagnosis not present

## 2021-12-05 ENCOUNTER — Emergency Department (HOSPITAL_BASED_OUTPATIENT_CLINIC_OR_DEPARTMENT_OTHER): Payer: BC Managed Care – PPO

## 2021-12-05 ENCOUNTER — Other Ambulatory Visit (HOSPITAL_BASED_OUTPATIENT_CLINIC_OR_DEPARTMENT_OTHER): Payer: Self-pay

## 2021-12-05 ENCOUNTER — Emergency Department (HOSPITAL_BASED_OUTPATIENT_CLINIC_OR_DEPARTMENT_OTHER)
Admission: EM | Admit: 2021-12-05 | Discharge: 2021-12-05 | Disposition: A | Discharge status: Home / Self Care | Payer: BC Managed Care – PPO | Attending: Emergency Medicine | Admitting: Emergency Medicine

## 2021-12-05 ENCOUNTER — Telehealth: Payer: Self-pay

## 2021-12-05 DIAGNOSIS — R7401 Elevation of levels of liver transaminase levels: Secondary | ICD-10-CM | POA: Insufficient documentation

## 2021-12-05 DIAGNOSIS — D72829 Elevated white blood cell count, unspecified: Secondary | ICD-10-CM | POA: Insufficient documentation

## 2021-12-05 DIAGNOSIS — R1032 Left lower quadrant pain: Secondary | ICD-10-CM | POA: Diagnosis not present

## 2021-12-05 DIAGNOSIS — K5792 Diverticulitis of intestine, part unspecified, without perforation or abscess without bleeding: Secondary | ICD-10-CM | POA: Diagnosis not present

## 2021-12-05 LAB — COMPREHENSIVE METABOLIC PANEL
ALT: 19 U/L (ref 0–44)
AST: 18 U/L (ref 15–41)
Albumin: 5 g/dL (ref 3.5–5.0)
Alkaline Phosphatase: 86 U/L (ref 38–126)
Anion gap: 9 (ref 5–15)
BUN: 18 mg/dL (ref 8–23)
CO2: 28 mmol/L (ref 22–32)
Calcium: 9.8 mg/dL (ref 8.9–10.3)
Chloride: 101 mmol/L (ref 98–111)
Creatinine, Ser: 1 mg/dL (ref 0.44–1.00)
GFR, Estimated: >60 mL/min (ref >60)
Glucose, Bld: 111 mg/dL — ABNORMAL HIGH (ref 70–99)
Potassium: 4.5 mmol/L (ref 3.5–5.1)
Sodium: 138 mmol/L (ref 135–145)
Total Bilirubin: 0.8 mg/dL (ref 0.3–1.2)
Total Protein: 7 g/dL (ref 6.5–8.1)

## 2021-12-05 LAB — URINALYSIS, MICROSCOPIC (REFLEX)

## 2021-12-05 LAB — URINALYSIS, ROUTINE W REFLEX MICROSCOPIC
Bilirubin Urine: NEGATIVE
Glucose, UA: NEGATIVE mg/dL
Ketones, ur: NEGATIVE mg/dL
Leukocytes,Ua: NEGATIVE
Nitrite: NEGATIVE
Protein, ur: 30 mg/dL — AB
Specific Gravity, Urine: 1.02 (ref 1.005–1.030)
pH: 5.5 (ref 5.0–8.0)

## 2021-12-05 LAB — CBC
HCT: 40.7 % (ref 36.0–46.0)
Hemoglobin: 13.2 g/dL (ref 12.0–15.0)
MCH: 29.1 pg (ref 26.0–34.0)
MCHC: 32.4 g/dL (ref 30.0–36.0)
MCV: 89.6 fL (ref 80.0–100.0)
Platelets: 241 10*3/uL (ref 150–400)
RBC: 4.54 MIL/uL (ref 3.87–5.11)
RDW: 12.9 % (ref 11.5–15.5)
WBC: 22.1 10*3/uL — ABNORMAL HIGH (ref 4.0–10.5)
nRBC: 0 % (ref 0.0–0.2)

## 2021-12-05 LAB — LIPASE, BLOOD: Lipase: 33 U/L (ref 11–51)

## 2021-12-05 MED ORDER — AMOXICILLIN-POT CLAVULANATE 875-125 MG PO TABS
1.0000 | ORAL_TABLET | Freq: Two times a day (BID) | ORAL | 0 refills | Status: DC
  Filled 2021-12-05: qty 14, 7d supply, fill #0

## 2021-12-05 MED ORDER — ONDANSETRON HCL 4 MG/2ML IJ SOLN
4.0000 mg | Freq: Once | INTRAMUSCULAR | Status: AC
  Administered 2021-12-05: 4 mg via INTRAVENOUS
  Filled 2021-12-05: qty 2

## 2021-12-05 MED ORDER — ONDANSETRON HCL 4 MG PO TABS
4.0000 mg | ORAL_TABLET | Freq: Four times a day (QID) | ORAL | 0 refills | Status: DC | PRN
  Filled 2021-12-05: qty 10, 3d supply, fill #0

## 2021-12-05 MED ORDER — SODIUM CHLORIDE 0.9 % IV BOLUS
1000.0000 mL | Freq: Once | INTRAVENOUS | Status: AC
  Administered 2021-12-05: 1000 mL via INTRAVENOUS

## 2021-12-05 MED ORDER — HYDROMORPHONE HCL 1 MG/ML IJ SOLN
1.0000 mg | Freq: Once | INTRAMUSCULAR | Status: AC
  Administered 2021-12-05: 1 mg via INTRAVENOUS
  Filled 2021-12-05: qty 1

## 2021-12-05 MED ORDER — OXYCODONE HCL 5 MG PO TABS
5.0000 mg | ORAL_TABLET | Freq: Four times a day (QID) | ORAL | 0 refills | Status: DC | PRN
  Filled 2021-12-05: qty 10, 3d supply, fill #0

## 2021-12-05 MED ORDER — PANTOPRAZOLE SODIUM 40 MG IV SOLR
40.0000 mg | Freq: Once | INTRAVENOUS | Status: AC
  Administered 2021-12-05: 40 mg via INTRAVENOUS
  Filled 2021-12-05: qty 10

## 2021-12-05 MED ORDER — IOHEXOL 300 MG/ML  SOLN
100.0000 mL | Freq: Once | INTRAMUSCULAR | Status: AC | PRN
  Administered 2021-12-05: 100 mL via INTRAVENOUS

## 2021-12-05 NOTE — Telephone Encounter (Signed)
Patient called stating that she was having excruciating pain that made her feel like she could not get up and walk in the Left lower quadrant of the abdomen. Patient stated that she had previously had diverticulosis. Patient stated that she felt chills last night but has not ran a fever but just feels awful. Patient was advised to go to the ER or [facility] to see someone ASAP due to severity of pain, and her concerning symptoms. Patient understood verbally and agreed to go.

## 2021-12-05 NOTE — ED Notes (Signed)
Patient transported to CT 

## 2021-12-05 NOTE — ED Provider Notes (Signed)
[facility] Provider Note   CSN: [id number] Arrival date & time: 12/05/21  5366     History {Add pertinent medical, surgical, social history, OB history to HPI:1} Chief Complaint  Patient presents with   Abdominal Pain    Alicia Snow is a 66 y.o. female.  Patient as above with significant medical history as below, including anal fissure, CLL (dr Marin Olp, no current tx/has not had tx needed per onc note), constipation, IBS, obesity who presents to the ED with complaint of abd pain x1 week.      Past Medical History:  Diagnosis Date   Anal fissure    Anxiety    Back pain    CLL (chronic lymphoblastic leukemia)    Constipation    Depression    Diverticulosis    Hemorrhoids    Hyperlipidemia    IBS (irritable bowel syndrome)    Nephrolithiasis    Obesity     Past Surgical History:  Procedure Laterality Date   ANAL FISSURE REPAIR     excision of polyp and external anal tag     LITHOTRIPSY       The history is provided by the patient. No language interpreter was used.  Abdominal Pain Pain location:  LLQ and suprapubic Pain quality: aching, cramping, sharp and stabbing   Pain radiates to:  Back (occ to back) Pain severity:  Moderate Onset quality:  Gradual Duration:  1 week Timing:  Constant Progression:  Worsening Chronicity:  Recurrent Context: not alcohol use, not previous surgeries, not suspicious food intake and not trauma   Relieved by:  Nothing Worsened by:  Movement and palpation Ineffective treatments:  None tried Associated symptoms: chills and diarrhea   Associated symptoms: no chest pain, no cough, no dysuria, no fever, no hematuria, no melena, no nausea and no shortness of breath   Risk factors: obesity        Home Medications Prior to Admission medications   Medication Sig Start Date End Date Taking? Authorizing Provider  co-enzyme Q-10 30 MG capsule Take 200 mg by mouth daily.     [provider]   FLUoxetine (PROZAC) 20 MG capsule Take 1 capsule by mouth once daily 08/23/21   Cox, Kirsten, MD  Multiple Vitamins-Minerals (CENTRUM SILVER ULTRA WOMENS) TABS Take 1 tablet by mouth daily.     [provider]  Probiotic Product (ALIGN) 4 MG CAPS Take 1 capsule by mouth daily.     [provider]  rosuvastatin (CRESTOR) 5 MG tablet TAKE 1 TABLET BY MOUTH AT BEDTIME 08/23/21   Cox, Kirsten, MD  VITAMIN D, ERGOCALCIFEROL, PO Take by mouth daily. 5000IU daily    [provider]      Allergies    Nitrofurantoin    Review of Systems   Review of Systems  Constitutional:  Positive for chills. Negative for activity change and fever.  HENT:  Negative for facial swelling and trouble swallowing.   Eyes:  Negative for discharge and redness.  Respiratory:  Negative for cough and shortness of breath.   Cardiovascular:  Negative for chest pain and palpitations.  Gastrointestinal:  Positive for abdominal pain and diarrhea. Negative for melena and nausea.  Genitourinary:  Negative for dysuria, flank pain and hematuria.  Musculoskeletal:  Negative for back pain and gait problem.  Skin:  Negative for pallor and rash.  Neurological:  Negative for syncope and headaches.    Physical Exam Updated Vital Signs BP (!) 142/86   Pulse 78  Temp 98.1 F (36.7 C) (Oral)   Resp 18   Ht '5\' 2"'$  (1.575 m)   Wt 88.5 kg   LMP 01/08/2006   SpO2 99%   BMI 35.67 kg/m  Physical Exam Vitals and nursing note reviewed.  Constitutional:      General: She is not in acute distress.    Appearance: Normal appearance. She is well-developed. She is obese. She is not ill-appearing, toxic-appearing or diaphoretic.  HENT:     Head: Normocephalic and atraumatic.     Right Ear: External ear normal.     Left Ear: External ear normal.     Nose: Nose normal.     Mouth/Throat:     Mouth: Mucous membranes are moist.  Eyes:     General: No scleral icterus.       Right eye: No discharge.        Left  eye: No discharge.  Cardiovascular:     Rate and Rhythm: Normal rate and regular rhythm.     Pulses: Normal pulses.     Heart sounds: Normal heart sounds.  Pulmonary:     Effort: Pulmonary effort is normal. No respiratory distress.     Breath sounds: Normal breath sounds.  Abdominal:     General: Abdomen is flat.     Tenderness: There is abdominal tenderness in the suprapubic area and left lower quadrant. There is no guarding or rebound.  Musculoskeletal:        General: Normal range of motion.     Cervical back: Normal range of motion.     Right lower leg: No edema.     Left lower leg: No edema.  Skin:    General: Skin is warm and dry.     Capillary Refill: Capillary refill takes less than 2 seconds.  Neurological:     Mental Status: She is alert and oriented to person, place, and time.     GCS: GCS eye subscore is 4. GCS verbal subscore is 5. GCS motor subscore is 6.  Psychiatric:        Mood and Affect: Mood normal.        Behavior: Behavior normal.     ED Results / Procedures / Treatments   Labs (all labs ordered are listed, but only abnormal results are displayed) Labs Reviewed  COMPREHENSIVE METABOLIC PANEL - Abnormal; Notable for the following components:      Result Value   Glucose, Bld 111 (*)    All other components within normal limits  CBC - Abnormal; Notable for the following components:   WBC 22.1 (*)    All other components within normal limits  LIPASE, BLOOD  URINALYSIS, ROUTINE W REFLEX MICROSCOPIC    EKG None  Radiology No results found.  Procedures Procedures  {Document cardiac monitor, telemetry assessment procedure when appropriate:1}  Medications Ordered in ED Medications  HYDROmorphone (DILAUDID) injection 1 mg (has no administration in time range)  ondansetron (ZOFRAN) injection 4 mg (has no administration in time range)  pantoprazole (PROTONIX) injection 40 mg (has no administration in time range)  sodium chloride 0.9 % bolus 1,000 mL  (1,000 mLs Intravenous New Bag/Given 12/05/21 1104)    ED Course/ Medical Decision Making/ A&P                           Medical Decision Making Amount and/or Complexity of Data Reviewed Labs: ordered. Radiology: ordered.  Risk Prescription drug management.   This patient presents  to the ED with chief complaint(s) of abd pain with pertinent past medical history of ibs, diverticulosis which further complicates the presenting complaint. The complaint involves an extensive differential diagnosis and also carries with it a high risk of complications and morbidity.     Differential diagnosis includes but is not exclusive to ectopic pregnancy, ovarian cyst, ovarian torsion, acute appendicitis, urinary tract infection, endometriosis, bowel obstruction, hernia, colitis, renal colic, gastroenteritis, volvulus etc.  . Serious etiologies were considered.   The initial plan is to screening labs imaging fluids analgesia   Additional history obtained: Additional history obtained from  na Records reviewed Primary Care Documents prior labs/imaging/ home meds  Independent labs interpretation:  The following labs were independently interpreted: ***  Independent visualization of imaging: - I independently visualized the following imaging with scope of interpretation limited to determining acute life threatening conditions related to emergency care: ***, which revealed ***  Cardiac monitoring was reviewed and interpreted by myself which shows ***  Treatment and Reassessment: ***  Consultation: - Consulted or discussed management/test interpretation w/ external professional: ***  Consideration for admission or further workup: Admission was considered ***  Social Determinants of health: Social History   Tobacco Use   Smoking status: Never   Smokeless tobacco: Never   Tobacco comments:    never used tobacco  Vaping Use   Vaping Use: Never used  Substance Use Topics   Alcohol use: No     Alcohol/week: 0.0 standard drinks of alcohol   Drug use: No      {Document critical care time when appropriate:1} {Document review of labs and clinical decision tools ie heart score, Chads2Vasc2 etc:1}  {Document your independent review of radiology images, and any outside records:1} {Document your discussion with family members, caretakers, and with consultants:1} {Document social determinants of health affecting pt's care:1} {Document your decision making why or why not admission, treatments were needed:1} Final Clinical Impression(s) / ED Diagnoses Final diagnoses:  None    Rx / DC Orders ED Discharge Orders     None

## 2021-12-05 NOTE — ED Notes (Signed)
Due to computer system downtime. Pt was discharged 1500. See additional paperwork.

## 2021-12-05 NOTE — ED Triage Notes (Signed)
C/o mid & LLQ abdominal pain. Denies N/V. Also c/o lower back pain.  Hx of kidney stones

## 2021-12-05 NOTE — Discharge Instructions (Addendum)
It was a pleasure caring for you today in the emergency department. ° °Please return to the emergency department for any worsening or worrisome symptoms. ° ° °

## 2021-12-20 DIAGNOSIS — R3915 Urgency of urination: Secondary | ICD-10-CM | POA: Diagnosis not present

## 2021-12-20 DIAGNOSIS — Z01419 Encounter for gynecological examination (general) (routine) without abnormal findings: Secondary | ICD-10-CM | POA: Diagnosis not present

## 2021-12-20 DIAGNOSIS — N3946 Mixed incontinence: Secondary | ICD-10-CM | POA: Diagnosis not present

## 2021-12-20 LAB — HM PAP SMEAR: HM Pap smear: NEGATIVE

## 2022-01-04 ENCOUNTER — Encounter: Payer: BC Managed Care – PPO | Admitting: Family Medicine

## 2022-01-08 DIAGNOSIS — L409 Psoriasis, unspecified: Secondary | ICD-10-CM

## 2022-01-08 HISTORY — DX: Psoriasis, unspecified: L40.9

## 2022-01-10 ENCOUNTER — Encounter: Payer: BC Managed Care – PPO | Admitting: Family Medicine

## 2022-01-18 ENCOUNTER — Encounter: Payer: Self-pay | Admitting: Family Medicine

## 2022-01-18 ENCOUNTER — Ambulatory Visit (INDEPENDENT_AMBULATORY_CARE_PROVIDER_SITE_OTHER): Payer: Commercial Managed Care - PPO | Admitting: Family Medicine

## 2022-01-18 VITALS — BP 138/72 | HR 58 | Temp 97.5°F | Ht 62.0 in | Wt 201.8 lb

## 2022-01-18 DIAGNOSIS — Z23 Encounter for immunization: Secondary | ICD-10-CM

## 2022-01-18 DIAGNOSIS — Z13820 Encounter for screening for osteoporosis: Secondary | ICD-10-CM | POA: Diagnosis not present

## 2022-01-18 DIAGNOSIS — E6609 Other obesity due to excess calories: Secondary | ICD-10-CM | POA: Diagnosis not present

## 2022-01-18 DIAGNOSIS — Z6831 Body mass index (BMI) 31.0-31.9, adult: Secondary | ICD-10-CM

## 2022-01-18 DIAGNOSIS — C9110 Chronic lymphocytic leukemia of B-cell type not having achieved remission: Secondary | ICD-10-CM | POA: Diagnosis not present

## 2022-01-18 DIAGNOSIS — Z0001 Encounter for general adult medical examination with abnormal findings: Secondary | ICD-10-CM

## 2022-01-18 DIAGNOSIS — Z0000 Encounter for general adult medical examination without abnormal findings: Secondary | ICD-10-CM

## 2022-01-18 DIAGNOSIS — Z780 Asymptomatic menopausal state: Secondary | ICD-10-CM

## 2022-01-18 DIAGNOSIS — E88819 Insulin resistance, unspecified: Secondary | ICD-10-CM

## 2022-01-18 NOTE — Progress Notes (Signed)
Subjective:  Patient ID: Alicia Snow, female    DOB: August 10, 1955  Age: 67 y.o. MRN: [id number]  Chief Complaint  Patient presents with   CPE    HPI Well Adult Physical: Patient here for a comprehensive physical exam.The patient reports no problems Do you take any herbs or supplements that were not prescribed by a doctor? no Are you taking calcium supplements? yes Are you taking aspirin daily? no  Encounter for general adult medical examination without abnormal findings  Physical ("At Risk" items are starred): Patient's last physical exam was 1 year ago .  Patient is not afflicted from Stress Incontinence and Urge Incontinence  Patient wears a seat belt, has smoke detectors, has carbon monoxide detectors, practices appropriate gun safety, and wears sunscreen with extended sun exposure. Dental Care: biannual cleanings, brushes and flosses daily. Ophthalmology/Optometry: Annual visit.  Hearing loss: none Vision impairments: none  12/2021: dxd with diverticulitis - given cipro and flagyl. Better now.      03/04/2019    9:20 AM 02/15/2020    8:10 AM 02/14/2021    8:41 AM 12/05/2021   10:01 AM 01/18/2022    2:24 PM  Fall Risk  Falls in the past year?     0  Was there an injury with Fall?     0  Fall Risk Category Calculator     0  Fall Risk Category (Retired)     Low  (RETIRED) Patient Fall Risk Level Low fall risk Low fall risk Low fall risk Low fall risk Low fall risk  Patient at Risk for Falls Due to     No Fall Risks  Fall risk Follow up     Falls evaluation completed     [facility] Office Visit from 01/18/2022 in [facility]  PHQ-2 Total Score 0         Social Hx   Social History   Socioeconomic History   Marital status: Married    Spouse name: Not on file   Number of children: 0   Years of education: Not on file   Highest education level: Not on file  Occupational History   Occupation: para legal  Tobacco Use   Smoking status: Never    Smokeless tobacco: Never   Tobacco comments:    never used tobacco  Vaping Use   Vaping Use: Never used  Substance and Sexual Activity   Alcohol use: No    Alcohol/week: 0.0 standard drinks of alcohol   Drug use: No   Sexual activity: Not on file  Other Topics Concern   Not on file  Social History Narrative   Not on file   Social Determinants of Health   Financial Resource Strain: Low Risk  (01/18/2022)   Overall Financial Resource Strain (CARDIA)    Difficulty of Paying Living Expenses: Not hard at all  Food Insecurity: No Food Insecurity (01/18/2022)   Hunger Vital Sign    Worried About Running Out of Food in the Last Year: Never true    Ran Out of Food in the Last Year: Never true  Transportation Needs: No Transportation Needs (01/18/2022)   PRAPARE - Hydrologist (Medical): No    Lack of Transportation (Non-Medical): No  Physical Activity: Sufficiently Active (01/18/2022)   Exercise Vital Sign    Days of Exercise per Week: 3 days    Minutes of Exercise per Session: 60 min  Stress: No Stress Concern Present (01/18/2022)  [employer] - Occupational Stress Questionnaire    Feeling of Stress : Not at all  Social Connections: Moderately Isolated (01/18/2022)   Social Connection and Isolation Panel [NHANES]    Frequency of Communication with Friends and Family: Three times a week    Frequency of Social Gatherings with Friends and Family: Three times a week    Attends Religious Services: Never    Active Member of Clubs or Organizations: No    Attends Music therapist: Never    Marital Status: Married   Past Medical History:  Diagnosis Date   Anal fissure    Anxiety    Back pain    CLL (chronic lymphoblastic leukemia)    Constipation    Depression    Diverticulosis    Hemorrhoids    Hyperlipidemia    IBS (irritable bowel syndrome)    Nephrolithiasis    Obesity    Past Surgical History:  Procedure  Laterality Date   ANAL FISSURE REPAIR     excision of polyp and external anal tag     LITHOTRIPSY      Family History  Problem Relation Age of Onset   Breast cancer Mother 15       died of breast cancer   Cancer Mother    Heart disease Father    Hypertension Father    Hyperlipidemia Father    Diabetes Sister        x2   Diabetes Brother    Stroke Brother    Breast cancer Maternal Grandmother 46       died of breast cancer   Liver cancer Maternal Grandfather    Breast cancer Paternal Grandmother        died of breast ca - age unknown   Heart disease Paternal Grandmother    Heart disease Paternal Grandfather    Colon cancer Neg Hx    Esophageal cancer Neg Hx    Rectal cancer Neg Hx    Colon polyps Neg Hx    Stomach cancer Neg Hx     Review of Systems  Constitutional:  Negative for appetite change, fatigue and fever.  HENT:  Negative for congestion, ear pain, sinus pressure and sore throat.   Respiratory:  Negative for cough, chest tightness, shortness of breath and wheezing.   Cardiovascular:  Negative for chest pain and palpitations.  Gastrointestinal:  Negative for abdominal pain, constipation, diarrhea, nausea and vomiting.  Genitourinary:  Negative for dysuria and hematuria.  Musculoskeletal:  Negative for arthralgias, back pain, joint swelling and myalgias.  Skin:  Negative for rash.  Neurological:  Negative for dizziness, weakness and headaches.  Psychiatric/Behavioral:  Negative for dysphoric mood. The patient is not nervous/anxious.      Objective:  BP 138/72 (BP Location: Left Arm, Patient Position: Sitting)   Pulse (!) 58   Temp (!) 97.5 F (36.4 C) (Temporal)   Ht '5\' 2"'$  (1.575 m)   Wt 201 lb 12.8 oz (91.5 kg)   LMP 01/08/2006   SpO2 98%   BMI 36.91 kg/m      01/18/2022    2:33 PM 12/05/2021    2:50 PM 12/05/2021    2:45 PM  BP/Weight  Systolic BP 354 562 563  Diastolic BP 72 68 64  Wt. (Lbs) 201.8    BMI 36.91 kg/m2      Physical  Exam Vitals reviewed.  Constitutional:      General: She is not in acute distress.    Appearance:  Normal appearance. She is obese.  HENT:     Right Ear: Tympanic membrane and ear canal normal.     Left Ear: Tympanic membrane and ear canal normal.     Nose: Nose normal. No congestion or rhinorrhea.  Eyes:     Conjunctiva/sclera: Conjunctivae normal.  Neck:     Thyroid: No thyroid mass.     Vascular: No carotid bruit.  Cardiovascular:     Rate and Rhythm: Normal rate and regular rhythm.     Pulses: Normal pulses.     Heart sounds: Normal heart sounds. No murmur heard. Pulmonary:     Effort: Pulmonary effort is normal.     Breath sounds: Normal breath sounds.  Abdominal:     General: Abdomen is flat. Bowel sounds are normal.     Palpations: Abdomen is soft. There is no mass.     Tenderness: There is no abdominal tenderness.  Musculoskeletal:        General: Normal range of motion.  Lymphadenopathy:     Cervical: No cervical adenopathy.  Skin:    General: Skin is warm and dry.  Neurological:     Mental Status: She is alert and oriented to person, place, and time.     Cranial Nerves: No cranial nerve deficit.  Psychiatric:        Mood and Affect: Mood normal.        Behavior: Behavior normal.     Lab Results  Component Value Date   WBC 15.6 (H) 01/19/2022   HGB 13.2 01/19/2022   HCT 39.7 01/19/2022   PLT 222 01/19/2022   GLUCOSE 93 01/19/2022   CHOL 154 01/19/2022   TRIG 102 01/19/2022   HDL 60 01/19/2022   LDLCALC 75 01/19/2022   ALT 18 01/19/2022   AST 18 01/19/2022   NA 142 01/19/2022   K 5.0 01/19/2022   CL 101 01/19/2022   CREATININE 1.01 (H) 01/19/2022   BUN 17 01/19/2022   CO2 22 01/19/2022   TSH 1.540 01/19/2022   HGBA1C 5.6 01/19/2022      Assessment & Plan:   Problem List Items Addressed This Visit       Endocrine   Insulin resistance    Recommend continue to work on eating healthy diet and exercise.  Ordered labs.      Relevant Orders    Hemoglobin A1c (Completed)     Other   Chronic lymphocytic leukemia ([place])    Management per specialist.      Class 1 obesity with serious comorbidity and body mass index (BMI) of 31.0 to 31.9 in adult    Recommend continue to work on eating healthy diet and exercise.       Routine medical exam - Primary    Ordered labs.  Things to do to keep yourself healthy  - Exercise at least 30-45 minutes a day, 3-4 days a week.  - Eat a low-fat diet with lots of fruits and vegetables, up to 7-9 servings per day.  - Seatbelts can save your life. Wear them always.  - Smoke detectors on every level of your home, check batteries every year.  - Eye Doctor - have an eye exam every 1-2 years  - Safe sex - if you may be exposed to STDs, use a condom.  - Alcohol -  If you drink, do it moderately, less than 2 drinks per day.  - [place]. Choose someone to speak for you if you are not able.  -  Depression is common in our stressful world.If you're feeling down or losing interest in things you normally enjoy, please come in for a visit.  - Violence - If anyone is threatening or hurting you, please call immediately.       Relevant Orders   CBC with Differential/Platelet (Completed)   Comprehensive metabolic panel (Completed)   Lipid panel (Completed)   TSH (Completed)   Encounter for osteoporosis screening in asymptomatic postmenopausal patient   Relevant Orders   DG Bone Density   Encounter for immunization   Relevant Orders   Pfizer Fall 2023 Covid-19 Vaccine 47yr and older (Completed)      Body mass index is 36.91 kg/m.   These are the goals we discussed:  Goals      Weight (lb) < 200 lb (90.7 kg)     Work on eating healthier and exercising.         This is a list of the screening recommended for you and due dates:  Health Maintenance  Topic Date Due   HIV Screening  Never done   Hepatitis C Screening: USPSTF Recommendation to screen - Ages 139-79yo.  Never  done   Pneumonia Vaccine (3 - PPSV23 or PCV20) 03/22/2018   COVID-19 Vaccine (8 - 2023-24 season) 03/15/2022   Mammogram  11/22/2023   Pap Smear  12/20/2024   DTaP/Tdap/Td vaccine (2 - Td or Tdap) 03/22/2027   Colon Cancer Screening  10/19/2030   Flu Shot  Completed   DEXA scan (bone density measurement)  Completed   Zoster (Shingles) Vaccine  Completed   HPV Vaccine  Aged Out     Follow-up: Return in about 1 day (around 01/19/2022) for lab visit.  An After Visit Summary was printed and given to the patient.  MThompson Caul CMA,acting as a scribe for KRochel Brome MD.,have documented all relevant documentation on the behalf of KRochel Brome MD,as directed by  KRochel Brome MD while in the presence of KRochel Brome MD.    KRochel Brome MD [employer]([phone]

## 2022-01-19 ENCOUNTER — Other Ambulatory Visit: Payer: Commercial Managed Care - PPO

## 2022-01-19 DIAGNOSIS — Z0000 Encounter for general adult medical examination without abnormal findings: Secondary | ICD-10-CM

## 2022-01-19 DIAGNOSIS — E88819 Insulin resistance, unspecified: Secondary | ICD-10-CM

## 2022-01-20 LAB — CBC WITH DIFFERENTIAL/PLATELET
Basophils Absolute: 0.1 10*3/uL (ref 0.0–0.2)
Basos: 0 %
EOS (ABSOLUTE): 0.2 10*3/uL (ref 0.0–0.4)
Eos: 1 %
Hematocrit: 39.7 % (ref 34.0–46.6)
Hemoglobin: 13.2 g/dL (ref 11.1–15.9)
Immature Grans (Abs): 0 10*3/uL (ref 0.0–0.1)
Immature Granulocytes: 0 %
Lymphocytes Absolute: 9.6 10*3/uL — ABNORMAL HIGH (ref 0.7–3.1)
Lymphs: 62 %
MCH: 29 pg (ref 26.6–33.0)
MCHC: 33.2 g/dL (ref 31.5–35.7)
MCV: 87 fL (ref 79–97)
Monocytes Absolute: 0.7 10*3/uL (ref 0.1–0.9)
Monocytes: 4 %
Neutrophils Absolute: 5.1 10*3/uL (ref 1.4–7.0)
Neutrophils: 33 %
Platelets: 222 10*3/uL (ref 150–450)
RBC: 4.55 x10E6/uL (ref 3.77–5.28)
RDW: 12.8 % (ref 11.7–15.4)
WBC: 15.6 10*3/uL — ABNORMAL HIGH (ref 3.4–10.8)

## 2022-01-20 LAB — COMPREHENSIVE METABOLIC PANEL
ALT: 18 IU/L (ref 0–32)
AST: 18 IU/L (ref 0–40)
Albumin/Globulin Ratio: 2.6 — ABNORMAL HIGH (ref 1.2–2.2)
Albumin: 4.9 g/dL (ref 3.9–4.9)
Alkaline Phosphatase: 89 IU/L (ref 44–121)
BUN/Creatinine Ratio: 17 (ref 12–28)
BUN: 17 mg/dL (ref 8–27)
Bilirubin Total: 0.4 mg/dL (ref 0.0–1.2)
CO2: 22 mmol/L (ref 20–29)
Calcium: 10 mg/dL (ref 8.7–10.3)
Chloride: 101 mmol/L (ref 96–106)
Creatinine, Ser: 1.01 mg/dL — ABNORMAL HIGH (ref 0.57–1.00)
Globulin, Total: 1.9 g/dL (ref 1.5–4.5)
Glucose: 93 mg/dL (ref 70–99)
Potassium: 5 mmol/L (ref 3.5–5.2)
Sodium: 142 mmol/L (ref 134–144)
Total Protein: 6.8 g/dL (ref 6.0–8.5)
eGFR: 62 mL/min/{1.73_m2} (ref >59)

## 2022-01-20 LAB — LIPID PANEL
Chol/HDL Ratio: 2.6 ratio (ref 0.0–4.4)
Cholesterol, Total: 154 mg/dL (ref 100–199)
HDL: 60 mg/dL (ref >39)
LDL Chol Calc (NIH): 75 mg/dL (ref 0–99)
Triglycerides: 102 mg/dL (ref 0–149)
VLDL Cholesterol Cal: 19 mg/dL (ref 5–40)

## 2022-01-20 LAB — CARDIOVASCULAR RISK ASSESSMENT

## 2022-01-20 LAB — HEMOGLOBIN A1C
Est. average glucose Bld gHb Est-mCnc: 114 mg/dL
Hgb A1c MFr Bld: 5.6 % (ref 4.8–5.6)

## 2022-01-20 LAB — TSH: TSH: 1.54 u[IU]/mL (ref 0.450–4.500)

## 2022-01-21 NOTE — Progress Notes (Signed)
Blood count abnormal, but wbc down to 15. Liver function normal.  Kidney function normal.  Thyroid function normal.  Cholesterol: improved. At goal HBA1C: 5.6. good

## 2022-01-27 DIAGNOSIS — Z0000 Encounter for general adult medical examination without abnormal findings: Secondary | ICD-10-CM

## 2022-01-27 HISTORY — DX: Encounter for general adult medical examination without abnormal findings: Z00.00

## 2022-01-27 NOTE — Assessment & Plan Note (Signed)
Recommend continue to work on eating healthy diet and exercise.  Ordered labs.

## 2022-01-27 NOTE — Assessment & Plan Note (Signed)
Recommend continue to work on eating healthy diet and exercise.  

## 2022-01-27 NOTE — Assessment & Plan Note (Addendum)
Ordered labs.  Things to do to keep yourself healthy  - Exercise at least 30-45 minutes a day, 3-4 days a week.  - Eat a low-fat diet with lots of fruits and vegetables, up to 7-9 servings per day.  - Seatbelts can save your life. Wear them always.  - Smoke detectors on every level of your home, check batteries every year.  - Eye Doctor - have an eye exam every 1-2 years  - Safe sex - if you may be exposed to STDs, use a condom.  - Alcohol -  If you drink, do it moderately, less than 2 drinks per day.  - [place]. Choose someone to speak for you if you are not able.  - Depression is common in our stressful world.If you're feeling down or losing interest in things you normally enjoy, please come in for a visit.  - Violence - If anyone is threatening or hurting you, please call immediately.

## 2022-01-27 NOTE — Assessment & Plan Note (Signed)
Management per specialist. 

## 2022-01-28 DIAGNOSIS — Z13820 Encounter for screening for osteoporosis: Secondary | ICD-10-CM | POA: Insufficient documentation

## 2022-01-28 DIAGNOSIS — Z23 Encounter for immunization: Secondary | ICD-10-CM | POA: Insufficient documentation

## 2022-01-28 DIAGNOSIS — Z780 Asymptomatic menopausal state: Secondary | ICD-10-CM

## 2022-01-28 HISTORY — DX: Encounter for screening for osteoporosis: Z78.0

## 2022-01-28 HISTORY — DX: Encounter for screening for osteoporosis: Z13.820

## 2022-02-12 ENCOUNTER — Ambulatory Visit: Payer: BC Managed Care – PPO | Admitting: Gastroenterology

## 2022-02-13 ENCOUNTER — Inpatient Hospital Stay: Payer: Commercial Managed Care - PPO | Attending: Hematology & Oncology

## 2022-02-13 ENCOUNTER — Encounter: Payer: Self-pay | Admitting: Hematology & Oncology

## 2022-02-13 ENCOUNTER — Other Ambulatory Visit: Payer: Self-pay

## 2022-02-13 ENCOUNTER — Inpatient Hospital Stay: Payer: Commercial Managed Care - PPO | Admitting: Hematology & Oncology

## 2022-02-13 VITALS — BP 127/64 | HR 68 | Temp 98.9°F | Resp 16 | Ht 62.0 in | Wt 194.0 lb

## 2022-02-13 DIAGNOSIS — C9110 Chronic lymphocytic leukemia of B-cell type not having achieved remission: Secondary | ICD-10-CM

## 2022-02-13 DIAGNOSIS — Z79899 Other long term (current) drug therapy: Secondary | ICD-10-CM | POA: Insufficient documentation

## 2022-02-13 DIAGNOSIS — Z881 Allergy status to other antibiotic agents status: Secondary | ICD-10-CM | POA: Insufficient documentation

## 2022-02-13 LAB — CBC WITH DIFFERENTIAL (CANCER CENTER ONLY)
Abs Immature Granulocytes: 0.03 10*3/uL (ref 0.00–0.07)
Basophils Absolute: 0.1 10*3/uL (ref 0.0–0.1)
Basophils Relative: 1 %
Eosinophils Absolute: 0.2 10*3/uL (ref 0.0–0.5)
Eosinophils Relative: 1 %
HCT: 41.5 % (ref 36.0–46.0)
Hemoglobin: 13.3 g/dL (ref 12.0–15.0)
Immature Granulocytes: 0 %
Lymphocytes Relative: 68 %
Lymphs Abs: 10.3 10*3/uL — ABNORMAL HIGH (ref 0.7–4.0)
MCH: 29.4 pg (ref 26.0–34.0)
MCHC: 32 g/dL (ref 30.0–36.0)
MCV: 91.6 fL (ref 80.0–100.0)
Monocytes Absolute: 0.6 10*3/uL (ref 0.1–1.0)
Monocytes Relative: 4 %
Neutro Abs: 3.9 10*3/uL (ref 1.7–7.7)
Neutrophils Relative %: 26 %
Platelet Count: 211 10*3/uL (ref 150–400)
RBC: 4.53 MIL/uL (ref 3.87–5.11)
RDW: 13 % (ref 11.5–15.5)
WBC Count: 15.1 10*3/uL — ABNORMAL HIGH (ref 4.0–10.5)
nRBC: 0 % (ref 0.0–0.2)

## 2022-02-13 LAB — CMP (CANCER CENTER ONLY)
ALT: 19 U/L (ref 0–44)
AST: 19 U/L (ref 15–41)
Albumin: 4.7 g/dL (ref 3.5–5.0)
Alkaline Phosphatase: 70 U/L (ref 38–126)
Anion gap: 10 (ref 5–15)
BUN: 23 mg/dL (ref 8–23)
CO2: 28 mmol/L (ref 22–32)
Calcium: 10.1 mg/dL (ref 8.9–10.3)
Chloride: 106 mmol/L (ref 98–111)
Creatinine: 1.11 mg/dL — ABNORMAL HIGH (ref 0.44–1.00)
GFR, Estimated: 55 mL/min — ABNORMAL LOW (ref >60)
Glucose, Bld: 106 mg/dL — ABNORMAL HIGH (ref 70–99)
Potassium: 5.1 mmol/L (ref 3.5–5.1)
Sodium: 144 mmol/L (ref 135–145)
Total Bilirubin: 0.6 mg/dL (ref 0.3–1.2)
Total Protein: 7.1 g/dL (ref 6.5–8.1)

## 2022-02-13 LAB — SAVE SMEAR(SSMR), FOR PROVIDER SLIDE REVIEW

## 2022-02-13 NOTE — Progress Notes (Signed)
Hematology and Oncology Follow Up Visit  Alicia Snow [id number] 1955-05-29 67 y.o. 02/13/2022   Principle Diagnosis:  Stage A CLL  Current Therapy:   Observation     Interim History:  Alicia Snow is back for followup.  We actually saw her a year ago.  Her birthday is coming up next week.  She has a [birth year]'s [birth year] birthday.  She will be 67 years old.  She did have a bout of diverticulitis back in November.  Thankfully, she was not hospitalized.  The scans on her.  There is no adenopathy.  There is no splenomegaly.  She is still working.  She wants to work for quite a while.  She has had no problems with infections outside of the diverticulitis.  She has had no cough or shortness of breath.  She has had no rashes.  There has been no bleeding.  She has had no issues with COVID.  There has been no leg swelling.  She walks quite a bit.  Overall, I would say that her performance status is probably ECOG 1.   Medications:  Current Outpatient Medications:    co-enzyme Q-10 30 MG capsule, Take 200 mg by mouth daily. , Disp: , Rfl:    FLUoxetine (PROZAC) 20 MG capsule, Take 1 capsule by mouth once daily, Disp: 90 capsule, Rfl: 1   Multiple Vitamins-Minerals (CENTRUM SILVER ULTRA WOMENS) TABS, Take 1 tablet by mouth daily. , Disp: , Rfl:    Probiotic Product (ALIGN) 4 MG CAPS, Take 1 capsule by mouth daily. , Disp: , Rfl:    rosuvastatin (CRESTOR) 5 MG tablet, TAKE 1 TABLET BY MOUTH AT BEDTIME, Disp: 90 tablet, Rfl: 1   VITAMIN D, ERGOCALCIFEROL, PO, Take by mouth daily. 5000IU daily, Disp: , Rfl:   Allergies:  Allergies  Allergen Reactions   Nitrofurantoin Hives, Diarrhea and Rash    Past Medical History, Surgical history, Social history, and Family History were reviewed and updated.  Review of Systems: Review of Systems  Constitutional: Negative.   HENT: Negative.    Eyes: Negative.   Respiratory: Negative.    Cardiovascular: Negative.   Gastrointestinal: Negative.    Genitourinary: Negative.   Musculoskeletal: Negative.   Skin: Negative.   Neurological: Negative.   Endo/Heme/Allergies: Negative.   Psychiatric/Behavioral: Negative.      Physical Exam:  height is '5\' 2"'$  (1.575 m) and weight is 194 lb (88 kg). Her oral temperature is 98.9 F (37.2 C). Her blood pressure is 127/64 and her pulse is 68. Her respiration is 16 and oxygen saturation is 99%.   Physical Exam Vitals reviewed.  HENT:     Head: Normocephalic and atraumatic.  Eyes:     Pupils: Pupils are equal, round, and reactive to light.  Cardiovascular:     Rate and Rhythm: Normal rate and regular rhythm.     Heart sounds: Normal heart sounds.  Pulmonary:     Effort: Pulmonary effort is normal.     Breath sounds: Normal breath sounds.  Abdominal:     General: Bowel sounds are normal.     Palpations: Abdomen is soft.  Musculoskeletal:        General: No tenderness or deformity. Normal range of motion.     Cervical back: Normal range of motion.  Lymphadenopathy:     Cervical: No cervical adenopathy.  Skin:    General: Skin is warm and dry.     Findings: No erythema or rash.  Neurological:     Mental  Status: She is alert and oriented to person, place, and time.  Psychiatric:        Behavior: Behavior normal.        Thought Content: Thought content normal.        Judgment: Judgment normal.    Lab Results  Component Value Date   WBC 15.1 (H) 02/13/2022   HGB 13.3 02/13/2022   HCT 41.5 02/13/2022   MCV 91.6 02/13/2022   PLT 211 02/13/2022     Chemistry      Component Value Date/Time   NA 144 02/13/2022 0823   NA 142 01/19/2022 1027   NA 141 11/07/2015 1130   K 5.1 02/13/2022 0823   K 3.9 11/07/2015 1130   CL 106 02/13/2022 0823   CL 103 08/01/2015 0932   CO2 28 02/13/2022 0823   CO2 22 11/07/2015 1130   BUN 23 02/13/2022 0823   BUN 17 01/19/2022 1027   BUN 23.9 11/07/2015 1130   CREATININE 1.11 (H) 02/13/2022 0823   CREATININE 0.9 11/07/2015 1130       Component Value Date/Time   CALCIUM 10.1 02/13/2022 0823   CALCIUM 9.5 11/07/2015 1130   ALKPHOS 70 02/13/2022 0823   ALKPHOS 88 11/07/2015 1130   AST 19 02/13/2022 0823   AST 19 11/07/2015 1130   ALT 19 02/13/2022 0823   ALT 21 11/07/2015 1130   BILITOT 0.6 02/13/2022 0823   BILITOT 0.68 11/07/2015 1130      Impression and Plan: Alicia Snow is a 67 year old white female with a CLL. Everything looks wonderful. I do not see any issues with the CLL. We've been following her now for 15 years.  In all honesty, she really has not changed at all.  She still looks as young as when we first saw her 15 years ago.  I know that she will have a wonderful birthday.  We will go ahead and plan to get her back in another year.  I just would be shocked if we ever had to treat her.  So far, there is been no indication that the CLL is active.   Volanda Napoleon, MD 2/6/20249:21 AM

## 2022-04-19 ENCOUNTER — Other Ambulatory Visit: Payer: Self-pay | Admitting: Family Medicine

## 2022-04-19 DIAGNOSIS — E7801 Familial hypercholesterolemia: Secondary | ICD-10-CM

## 2022-06-24 ENCOUNTER — Encounter (HOSPITAL_BASED_OUTPATIENT_CLINIC_OR_DEPARTMENT_OTHER): Payer: Self-pay

## 2022-06-24 ENCOUNTER — Emergency Department (HOSPITAL_BASED_OUTPATIENT_CLINIC_OR_DEPARTMENT_OTHER): Payer: Commercial Managed Care - PPO

## 2022-06-24 ENCOUNTER — Emergency Department (HOSPITAL_BASED_OUTPATIENT_CLINIC_OR_DEPARTMENT_OTHER)
Admission: EM | Admit: 2022-06-24 | Discharge: 2022-06-24 | Disposition: A | Discharge status: Home / Self Care | Payer: Commercial Managed Care - PPO | Attending: Emergency Medicine | Admitting: Emergency Medicine

## 2022-06-24 ENCOUNTER — Other Ambulatory Visit: Payer: Self-pay

## 2022-06-24 DIAGNOSIS — R1032 Left lower quadrant pain: Secondary | ICD-10-CM | POA: Diagnosis present

## 2022-06-24 DIAGNOSIS — K5790 Diverticulosis of intestine, part unspecified, without perforation or abscess without bleeding: Secondary | ICD-10-CM | POA: Insufficient documentation

## 2022-06-24 DIAGNOSIS — K5792 Diverticulitis of intestine, part unspecified, without perforation or abscess without bleeding: Secondary | ICD-10-CM

## 2022-06-24 DIAGNOSIS — D72829 Elevated white blood cell count, unspecified: Secondary | ICD-10-CM | POA: Insufficient documentation

## 2022-06-24 DIAGNOSIS — R739 Hyperglycemia, unspecified: Secondary | ICD-10-CM | POA: Insufficient documentation

## 2022-06-24 LAB — COMPREHENSIVE METABOLIC PANEL
ALT: 21 U/L (ref 0–44)
AST: 22 U/L (ref 15–41)
Albumin: 4.5 g/dL (ref 3.5–5.0)
Alkaline Phosphatase: 81 U/L (ref 38–126)
Anion gap: 11 (ref 5–15)
BUN: 20 mg/dL (ref 8–23)
CO2: 26 mmol/L (ref 22–32)
Calcium: 9.2 mg/dL (ref 8.9–10.3)
Chloride: 98 mmol/L (ref 98–111)
Creatinine, Ser: 1 mg/dL (ref 0.44–1.00)
GFR, Estimated: >60 mL/min (ref >60)
Glucose, Bld: 114 mg/dL — ABNORMAL HIGH (ref 70–99)
Potassium: 3.5 mmol/L (ref 3.5–5.1)
Sodium: 135 mmol/L (ref 135–145)
Total Bilirubin: 0.6 mg/dL (ref 0.3–1.2)
Total Protein: 7.5 g/dL (ref 6.5–8.1)

## 2022-06-24 LAB — CBC WITH DIFFERENTIAL/PLATELET
Abs Immature Granulocytes: 0 10*3/uL (ref 0.00–0.07)
Band Neutrophils: 0 %
Basophils Absolute: 0 10*3/uL (ref 0.0–0.1)
Basophils Relative: 0 %
Blasts: 0 %
Eosinophils Absolute: 0.2 10*3/uL (ref 0.0–0.5)
Eosinophils Relative: 1 %
HCT: 40.3 % (ref 36.0–46.0)
Hemoglobin: 13.3 g/dL (ref 12.0–15.0)
Lymphocytes Relative: 52 %
Lymphs Abs: 10.7 10*3/uL — ABNORMAL HIGH (ref 0.7–4.0)
MCH: 29.2 pg (ref 26.0–34.0)
MCHC: 33 g/dL (ref 30.0–36.0)
MCV: 88.6 fL (ref 80.0–100.0)
Metamyelocytes Relative: 0 %
Monocytes Absolute: 1 10*3/uL (ref 0.1–1.0)
Monocytes Relative: 5 %
Myelocytes: 0 %
Neutro Abs: 8.6 10*3/uL — ABNORMAL HIGH (ref 1.7–7.7)
Neutrophils Relative %: 42 %
Other: 0 %
Platelets: 213 10*3/uL (ref 150–400)
Promyelocytes Relative: 0 %
RBC: 4.55 MIL/uL (ref 3.87–5.11)
RDW: 12.9 % (ref 11.5–15.5)
WBC: 20.5 10*3/uL — ABNORMAL HIGH (ref 4.0–10.5)
nRBC: 0 % (ref 0.0–0.2)
nRBC: 0 /100 WBC

## 2022-06-24 LAB — URINALYSIS, ROUTINE W REFLEX MICROSCOPIC
Bilirubin Urine: NEGATIVE
Glucose, UA: NEGATIVE mg/dL
Ketones, ur: NEGATIVE mg/dL
Leukocytes,Ua: NEGATIVE
Nitrite: NEGATIVE
Protein, ur: NEGATIVE mg/dL
Specific Gravity, Urine: 1.02 (ref 1.005–1.030)
pH: 5.5 (ref 5.0–8.0)

## 2022-06-24 LAB — URINALYSIS, MICROSCOPIC (REFLEX)

## 2022-06-24 LAB — LIPASE, BLOOD: Lipase: 40 U/L (ref 11–51)

## 2022-06-24 MED ORDER — IOHEXOL 300 MG/ML  SOLN
75.0000 mL | Freq: Once | INTRAMUSCULAR | Status: AC | PRN
  Administered 2022-06-24: 75 mL via INTRAVENOUS

## 2022-06-24 MED ORDER — AMOXICILLIN-POT CLAVULANATE 875-125 MG PO TABS: 1.0000 | ORAL_TABLET | Freq: Two times a day (BID) | ORAL | 0 refills | Status: DC

## 2022-06-24 MED ORDER — SODIUM CHLORIDE 0.9 % IV BOLUS
1000.0000 mL | Freq: Once | INTRAVENOUS | Status: AC
  Administered 2022-06-24: 1000 mL via INTRAVENOUS

## 2022-06-24 MED ORDER — AMOXICILLIN-POT CLAVULANATE 875-125 MG PO TABS
1.0000 | ORAL_TABLET | Freq: Once | ORAL | Status: AC
  Administered 2022-06-24: 1 via ORAL
  Filled 2022-06-24: qty 1

## 2022-06-24 NOTE — Discharge Instructions (Signed)
You have been seen today for your complaint of abdominal pain. Your lab work was reassuring. Your imaging showed diverticulitis.  It also showed a tiny pulmonary nodule.  You should bring this up with your primary care provider.  You are low risk and the recommendation is not to have any follow-up imaging. Your discharge medications include Augmentin. This is an antibiotic. You should take it as prescribed. You should take it for the entire duration of the prescription. This may cause an upset stomach. This is normal. You may take this with food. You may also eat yogurt to prevent diarrhea. Home care instructions are as follows:  Drink a clear liquid diet until your symptoms resolve Follow up with: Your primary care provider in 1 week for reevaluation Please seek immediate medical care if you develop any of the following symptoms: Your pain gets worse. Your pooping does not go back to normal. Your symptoms do not get better with treatment. Your symptoms get worse all of a sudden. You have a fever. You vomit more than one time. Your poop is bloody, black, or tarry. At this time there does not appear to be the presence of an emergent medical condition, however there is always the potential for conditions to change. Please read and follow the below instructions.  Do not take your medicine if  develop an itchy rash, swelling in your mouth or lips, or difficulty breathing; call 911 and seek immediate emergency medical attention if this occurs.  You may review your lab tests and imaging results in their entirety on your MyChart account.  Please discuss all results of fully with your primary care provider and other specialist at your follow-up visit.  Note: Portions of this text may have been transcribed using voice recognition software. Every effort was made to ensure accuracy; however, inadvertent computerized transcription errors may still be present.

## 2022-06-24 NOTE — ED Provider Notes (Signed)
[facility] Provider Note   CSN: [id number] Arrival date & time: 06/24/22  2022     History  Chief Complaint  Patient presents with   Abdominal Pain    Alicia Snow is a 67 y.o. female.  With history of anxiety, depression, CLL, IBS-D who presents to the ED for evaluation of left lower quadrant abdominal pain.  States this began on Friday.  Has progressively gotten worse.  Describes it as a cramping sensation.  It is worse with movements.  Rates it at a 4 out of 10 at rest, 6 out of 10 with movement.  She denies any nausea, vomiting, diarrhea.  States her last full bowel movement was the day before symptom onset.  She has had a few small of fluids since that time.  She denies fevers or chills.  States it feels similar to when she has had diverticulitis in the past.  She denies any history of smoking.  No recreational drug use.  No alcohol use.   Abdominal Pain      Home Medications Prior to Admission medications   Medication Sig Start Date End Date Taking? Authorizing Provider  amoxicillin-clavulanate (AUGMENTIN) 875-125 MG tablet Take 1 tablet by mouth every 12 (twelve) hours. 06/24/22  Yes Lorriann Hansmann, Edsel Petrin, PA-C  co-enzyme Q-10 30 MG capsule Take 200 mg by mouth daily.     [provider]  FLUoxetine (PROZAC) 20 MG capsule Take 1 capsule by mouth once daily 04/19/22   Cox, Kirsten, MD  Multiple Vitamins-Minerals (CENTRUM SILVER ULTRA WOMENS) TABS Take 1 tablet by mouth daily.     [provider]  Probiotic Product (ALIGN) 4 MG CAPS Take 1 capsule by mouth daily.     [provider]  rosuvastatin (CRESTOR) 5 MG tablet TAKE 1 TABLET BY MOUTH EVERY DAY AT BEDTIME 04/19/22   Cox, Kirsten, MD  VITAMIN D, ERGOCALCIFEROL, PO Take by mouth daily. 5000IU daily    [provider]      Allergies    Nitrofurantoin    Review of Systems   Review of Systems  Gastrointestinal:  Positive for abdominal pain.   All other systems reviewed and are negative.   Physical Exam Updated Vital Signs BP (!) 171/91 (BP Location: Right Arm)   Pulse 90   Temp 98.3 F (36.8 C) (Oral)   Resp 16   Ht 5\' 2"  (1.575 m)   Wt 89.8 kg   LMP 01/08/2006   SpO2 96%   BMI 36.21 kg/m  Physical Exam Vitals and nursing note reviewed.  Constitutional:      General: She is not in acute distress.    Appearance: She is well-developed.     Comments: Resting comfortably in bed  HENT:     Head: Normocephalic and atraumatic.  Eyes:     Conjunctiva/sclera: Conjunctivae normal.  Cardiovascular:     Rate and Rhythm: Normal rate and regular rhythm.     Heart sounds: No murmur heard. Pulmonary:     Effort: Pulmonary effort is normal. No respiratory distress.     Breath sounds: Normal breath sounds.  Abdominal:     General: Abdomen is flat.     Palpations: Abdomen is soft.     Tenderness: There is abdominal tenderness in the left lower quadrant. There is no guarding or rebound. Negative signs include Murphy's sign and McBurney's sign.  Musculoskeletal:        General: No swelling.     Cervical  back: Neck supple.  Skin:    General: Skin is warm and dry.     Capillary Refill: Capillary refill takes less than 2 seconds.  Neurological:     General: No focal deficit present.     Mental Status: She is alert and oriented to person, place, and time.  Psychiatric:        Mood and Affect: Mood normal.        Behavior: Behavior normal.     ED Results / Procedures / Treatments   Labs (all labs ordered are listed, but only abnormal results are displayed) Labs Reviewed  CBC WITH DIFFERENTIAL/PLATELET - Abnormal; Notable for the following components:      Result Value   WBC 20.5 (*)    Neutro Abs 8.6 (*)    Lymphs Abs 10.7 (*)    All other components within normal limits  COMPREHENSIVE METABOLIC PANEL - Abnormal; Notable for the following components:   Glucose, Bld 114 (*)    All other components within normal  limits  URINALYSIS, ROUTINE W REFLEX MICROSCOPIC - Abnormal; Notable for the following components:   Hgb urine dipstick MODERATE (*)    All other components within normal limits  URINALYSIS, MICROSCOPIC (REFLEX) - Abnormal; Notable for the following components:   Bacteria, UA RARE (*)    All other components within normal limits  LIPASE, BLOOD  PATHOLOGIST SMEAR REVIEW    EKG None  Radiology CT ABDOMEN PELVIS W CONTRAST  Result Date: 06/24/2022 CLINICAL DATA:  Acute abdominal pain EXAM: CT ABDOMEN AND PELVIS WITH CONTRAST TECHNIQUE: Multidetector CT imaging of the abdomen and pelvis was performed using the standard protocol following bolus administration of intravenous contrast. RADIATION DOSE REDUCTION: This exam was performed according to the departmental dose-optimization program which includes automated exposure control, adjustment of the mA and/or kV according to patient size and/or use of iterative reconstruction technique. CONTRAST:  75mL OMNIPAQUE IOHEXOL 300 MG/ML  SOLN COMPARISON:  CT abdomen and pelvis 11/27/2021 FINDINGS: Lower chest: 4 mm left lower lobe nodule is unchanged from prior. No acute findings. Hepatobiliary: Gallstones are present. There is no biliary ductal dilatation. The liver is within normal limits. Pancreas: Unremarkable. No pancreatic ductal dilatation or surrounding inflammatory changes. Spleen: Normal in size without focal abnormality. Adrenals/Urinary Tract: Adrenal glands are unremarkable. Kidneys are normal, without renal calculi, focal lesion, or hydronephrosis. Bladder is unremarkable. Stomach/Bowel: There is sigmoid colon diverticulosis. There is inflammatory fat stranding surrounding and oval fat lobule adjacent to the sigmoid colon image 2/65. There is no abscess. There is no bowel obstruction or free air. The appendix, small bowel and stomach are within normal limits. Vascular/Lymphatic: No significant vascular findings are present. No enlarged abdominal or  pelvic lymph nodes. Reproductive: Uterus and bilateral adnexa are unremarkable. Other: There is a small fat containing umbilical hernia. There is no ascites. Musculoskeletal: No acute or significant osseous findings. IMPRESSION: 1. Acute sigmoid colon diverticulitis versus epiploic appendagitis. No abscess or free air. 2. Cholelithiasis. 3. 4 mm left solid pulmonary nodule. No follow-up needed if patient is low-risk.This recommendation follows the consensus statement: Guidelines for Management of Incidental Pulmonary Nodules Detected on CT Images: From the Fleischner Society 2017; Radiology 2017; 284:228-243. Electronically Signed   By: Darliss Cheney M.D.   On: 06/24/2022 21:55    Procedures Procedures    Medications Ordered in ED Medications  amoxicillin-clavulanate (AUGMENTIN) 875-125 MG per tablet 1 tablet (has no administration in time range)  sodium chloride 0.9 % bolus 1,000 mL (1,000 mLs  Intravenous New Bag/Given 06/24/22 2117)  iohexol (OMNIPAQUE) 300 MG/ML solution 75 mL (75 mLs Intravenous Contrast Given 06/24/22 2140)    ED Course/ Medical Decision Making/ A&P                             Medical Decision Making Amount and/or Complexity of Data Reviewed Labs: ordered. Radiology: ordered.  This patient presents to the ED for concern of left lower quadrant abdominal pain, this involves an extensive number of treatment options, and is a complaint that carries with it a high risk of complications and morbidity.  The differential diagnosis for generalized abdominal pain includes, but is not limited to AAA, gastroenteritis, appendicitis, Bowel obstruction, Bowel perforation. Gastroparesis, DKA, Hernia, Inflammatory bowel disease, mesenteric ischemia, pancreatitis, peritonitis SBP, volvulus.   Co morbidities that complicate the patient evaluation  anxiety, depression, CLL, IBS-D   My initial workup includes labs, imaging, fluids.  Patient declines pain medication  Additional history  obtained from: Nursing notes from this visit. Family husband is at bedside and provides portion of the history  I ordered, reviewed and interpreted labs which include: CBC, CMP, lipase, urinalysis.  Leukocytosis of 20.5.  Patient has CLL.  Hyperglycemia of 114.  I ordered imaging studies including CT abdomen pelvis I independently visualized and interpreted imaging which showed diverticulitis I agree with the radiologist interpretation  Afebrile, hypertensive but otherwise hemodynamically stable.  67 year old female presents to the ED for evaluation of left lower quadrant abdominal pain.  She noticed this approximately 4 days ago.  No nausea, vomiting or diarrhea.  She does report some constipation.  She has a history of IBS-D.  She appears very well on physical exam.  She has some mild left lower quadrant abdominal TTP.  CT scan consistent with diverticulitis.  Patient be treated with Augmentin and clear liquid diet.  She was encouraged to follow-up with her PCP in 5 days for reevaluation of her symptoms.  She was given return precautions.  Stable at discharge.  At this time there does not appear to be any evidence of an acute emergency medical condition and the patient appears stable for discharge with appropriate outpatient follow up. Diagnosis was discussed with patient who verbalizes understanding of care plan and is agreeable to discharge. I have discussed return precautions with patient and husband who verbalizes understanding. Patient encouraged to follow-up with their PCP within 5 days. All questions answered.  Patient's case discussed with Dr. Lockie Mola who agrees with plan to discharge with follow-up.   Note: Portions of this report may have been transcribed using voice recognition software. Every effort was made to ensure accuracy; however, inadvertent computerized transcription errors may still be present.        Final Clinical Impression(s) / ED Diagnoses Final diagnoses:   Diverticulitis    Rx / DC Orders ED Discharge Orders          Ordered    amoxicillin-clavulanate (AUGMENTIN) 875-125 MG tablet  Every 12 hours        06/24/22 2211              Michelle Piper, Cordelia Poche 06/24/22 2213    Virgina Norfolk, DO 06/24/22 2226

## 2022-06-24 NOTE — ED Triage Notes (Signed)
Pt with abd pain that makes it hurt when she walks. Pt was seen in November for similar issue and she had diverticulitis. Pt also has hx of stone in gallbladder. Pt thinks this might be the same issue. Denies N/V/D; no fevers.

## 2022-06-26 LAB — PATHOLOGIST SMEAR REVIEW

## 2022-06-28 ENCOUNTER — Telehealth: Payer: Self-pay

## 2022-06-28 NOTE — Telephone Encounter (Signed)
Patient called stating that she needed to schedule a hospital follow up appointment. She states that she went to the [facility] and that they recommended her to follow up with her Dr. About her CT Scan results because there was something seen on the scan non-related to what she was there for which was Diverticulitis.   Results:   IMPRESSION: 1. Acute sigmoid colon diverticulitis versus epiploic appendagitis. No abscess or free air. 2. Cholelithiasis. 3. 4 mm left solid pulmonary nodule. No follow-up needed if patient is low-risk.This recommendation follows the consensus statement: Guidelines for Management of Incidental Pulmonary Nodules Detected on CT Images: From the Fleischner Society 2017; Radiology 2017; 284:228-243.    Please advise scheduling time and date for hospital follow up next week.

## 2022-07-04 ENCOUNTER — Ambulatory Visit: Payer: Commercial Managed Care - PPO | Admitting: Family Medicine

## 2022-07-04 VITALS — BP 118/80 | HR 66 | Temp 97.0°F | Ht 62.0 in | Wt 197.0 lb

## 2022-07-04 DIAGNOSIS — K8020 Calculus of gallbladder without cholecystitis without obstruction: Secondary | ICD-10-CM

## 2022-07-04 DIAGNOSIS — K5792 Diverticulitis of intestine, part unspecified, without perforation or abscess without bleeding: Secondary | ICD-10-CM

## 2022-07-04 NOTE — Progress Notes (Signed)
Subjective:  Patient ID: Alicia Snow, female    DOB: Feb 14, 1955  Age: 67 y.o. MRN: [id number]  Chief Complaint  Patient presents with   Diverticulitis    Hospital F/U    HPI Patient here today for follow up from [facility] in [place] where is was diagnose with Diverticulitis on 06/24/2022. Patient states she is feeling much better today, but does have some concern about her CT results that was [facility] the [facility].       07/04/2022    1:37 PM 01/18/2022    2:24 PM 01/05/2021    8:22 AM 01/04/2020    8:58 PM 07/08/2019    9:53 AM  Depression screen PHQ 2/9  Decreased Interest 0 0 0 0 1  Down, Depressed, Hopeless 0 0 0 0 1  PHQ - 2 Score 0 0 0 0 2  Altered sleeping   0  0  Tired, decreased energy   0  3  Change in appetite   0  1  Feeling bad or failure about yourself    0  0  Trouble concentrating   0  0  Moving slowly or fidgety/restless   0  0  Suicidal thoughts   0  0  PHQ-9 Score   0  6  Difficult doing work/chores   Not difficult at all  Not difficult at all        07/04/2022    1:37 PM  Fall Risk   Falls in the past year? 0  Number falls in past yr: 0  Injury with Fall? 0  Risk for fall due to : No Fall Risks  Follow up Follow up appointment    Patient Care Team: Blane Ohara, MD as PCP - General (Family Medicine)   Review of Systems  Constitutional:  Negative for fatigue.  HENT:  Negative for congestion, ear pain and sore throat.   Respiratory:  Negative for cough and shortness of breath.   Cardiovascular:  Negative for chest pain.  Gastrointestinal:  Negative for abdominal pain, constipation, diarrhea, nausea and vomiting.  Genitourinary:  Negative for dysuria, frequency and urgency.  Musculoskeletal:  Negative for arthralgias, back pain and myalgias.  Neurological:  Negative for dizziness and headaches.  Psychiatric/Behavioral:  Negative for agitation and sleep disturbance. The patient is not nervous/anxious.     Current Outpatient  Medications on File Prior to Visit  Medication Sig Dispense Refill   co-enzyme Q-10 30 MG capsule Take 200 mg by mouth daily.      FLUoxetine (PROZAC) 20 MG capsule Take 1 capsule by mouth once daily 90 capsule 0   Multiple Vitamins-Minerals (CENTRUM SILVER ULTRA WOMENS) TABS Take 1 tablet by mouth daily.      Probiotic Product (ALIGN) 4 MG CAPS Take 1 capsule by mouth daily.      rosuvastatin (CRESTOR) 5 MG tablet TAKE 1 TABLET BY MOUTH EVERY DAY AT BEDTIME 90 tablet 0   VITAMIN D, ERGOCALCIFEROL, PO Take by mouth daily. 5000IU daily     No current facility-administered medications on file prior to visit.   Past Medical History:  Diagnosis Date   Anal fissure    Anxiety    Back pain    CLL (chronic lymphoblastic leukemia)    Constipation    Depression    Diverticulosis    Hemorrhoids    Hyperlipidemia    IBS (irritable bowel syndrome)    Nephrolithiasis    Obesity    Past Surgical History:  Procedure Laterality Date   ANAL FISSURE REPAIR     excision of polyp and external anal tag     LITHOTRIPSY      Family History  Problem Relation Age of Onset   Breast cancer Mother 30       died of breast cancer   Cancer Mother    Heart disease Father    Hypertension Father    Hyperlipidemia Father    Diabetes Sister        x2   Diabetes Brother    Stroke Brother    Breast cancer Maternal Grandmother 42       died of breast cancer   Liver cancer Maternal Grandfather    Breast cancer Paternal Grandmother        died of breast ca - age unknown   Heart disease Paternal Grandmother    Heart disease Paternal Grandfather    Colon cancer Neg Hx    Esophageal cancer Neg Hx    Rectal cancer Neg Hx    Colon polyps Neg Hx    Stomach cancer Neg Hx    Social History   Socioeconomic History   Marital status: Married    Spouse name: Not on file   Number of children: 0   Years of education: Not on file   Highest education level: Not on file  Occupational History   Occupation:  [employer]  Tobacco Use   Smoking status: Never   Smokeless tobacco: Never   Tobacco comments:    never used tobacco  Vaping Use   Vaping Use: Never used  Substance and Sexual Activity   Alcohol use: No    Alcohol/week: 0.0 standard drinks of alcohol   Drug use: No   Sexual activity: Not on file  Other Topics Concern   Not on file  Social History Narrative   Not on file   Social Determinants of Health   Financial Resource Strain: Low Risk  (01/18/2022)   Overall Financial Resource Strain (CARDIA)    Difficulty of Paying Living Expenses: Not hard at all  Food Insecurity: No Food Insecurity (01/18/2022)   Hunger Vital Sign    Worried About Running Out of Food in the Last Year: Never true    Ran Out of Food in the Last Year: Never true  Transportation Needs: No Transportation Needs (01/18/2022)   PRAPARE - Administrator, Civil Service (Medical): No    Lack of Transportation (Non-Medical): No  Physical Activity: Sufficiently Active (01/18/2022)   Exercise Vital Sign    Days of Exercise per Week: 3 days    Minutes of Exercise per Session: 60 min  Stress: No Stress Concern Present (01/18/2022)   [employer] of Occupational Health - Occupational Stress Questionnaire    Feeling of Stress : Not at all  Social Connections: Moderately Isolated (01/18/2022)   Social Connection and Isolation Panel [NHANES]    Frequency of Communication with Friends and Family: Three times a week    Frequency of Social Gatherings with Friends and Family: Three times a week    Attends Religious Services: Never    Active Member of Clubs or Organizations: No    Attends Banker Meetings: Never    Marital Status: Married    Objective:  BP 118/80 (BP Location: Left Arm, Patient Position: Sitting, Cuff Size: Large)   Pulse 66   Temp (!) 97 F (36.1 C) (Temporal)   Ht 5\' 2"  (1.575 m)   Wt 197 lb (  89.4 kg)   LMP 01/08/2006   SpO2 99%   BMI 36.03 kg/m      07/04/2022     1:31 PM 06/24/2022    8:34 PM 06/24/2022    8:29 PM  BP/Weight  Systolic BP 118  161  Diastolic BP 80  91  Wt. (Lbs) 197 198   BMI 36.03 kg/m2 36.21 kg/m2     Physical Exam Vitals reviewed.  Constitutional:      Appearance: Normal appearance. She is normal weight.  Cardiovascular:     Rate and Rhythm: Normal rate and regular rhythm.     Heart sounds: Normal heart sounds.  Pulmonary:     Effort: Pulmonary effort is normal. No respiratory distress.     Breath sounds: Normal breath sounds.  Abdominal:     General: Abdomen is flat. Bowel sounds are normal.     Palpations: Abdomen is soft.     Tenderness: There is no abdominal tenderness.  Neurological:     Mental Status: She is alert and oriented to person, place, and time.  Psychiatric:        Mood and Affect: Mood normal.        Behavior: Behavior normal.     Diabetic Foot Exam - Simple   No data filed      Lab Results  Component Value Date   WBC 20.5 (H) 06/24/2022   HGB 13.3 06/24/2022   HCT 40.3 06/24/2022   PLT 213 06/24/2022   GLUCOSE 114 (H) 06/24/2022   CHOL 154 01/19/2022   TRIG 102 01/19/2022   HDL 60 01/19/2022   LDLCALC 75 01/19/2022   ALT 21 06/24/2022   AST 22 06/24/2022   NA 135 06/24/2022   K 3.5 06/24/2022   CL 98 06/24/2022   CREATININE 1.00 06/24/2022   BUN 20 06/24/2022   CO2 26 06/24/2022   TSH 1.540 01/19/2022   HGBA1C 5.6 01/19/2022      Assessment & Plan:    Diverticulitis Assessment & Plan: RESOLVED.   Calculus of gallbladder without cholecystitis without obstruction Assessment & Plan: Asymptomatic. Reassurance given.      No orders of the defined types were placed in this encounter.   No orders of the defined types were placed in this encounter.    Follow-up: Return if symptoms worsen or fail to improve.   I,Jacqua L Marsh,acting as a scribe for Blane Ohara, MD.,have documented all relevant documentation on the behalf of Blane Ohara, MD,as directed by  Blane Ohara, MD while in the presence of Blane Ohara, MD.   An After Visit Summary was printed and given to the patient.  Blane Ohara, MD [facility] [phone]

## 2022-07-08 ENCOUNTER — Encounter: Payer: Self-pay | Admitting: Family Medicine

## 2022-07-08 DIAGNOSIS — K5792 Diverticulitis of intestine, part unspecified, without perforation or abscess without bleeding: Secondary | ICD-10-CM

## 2022-07-08 DIAGNOSIS — K8020 Calculus of gallbladder without cholecystitis without obstruction: Secondary | ICD-10-CM | POA: Insufficient documentation

## 2022-07-08 HISTORY — DX: Diverticulitis of intestine, part unspecified, without perforation or abscess without bleeding: K57.92

## 2022-07-08 HISTORY — DX: Calculus of gallbladder without cholecystitis without obstruction: K80.20

## 2022-07-08 NOTE — Assessment & Plan Note (Signed)
RESOLVED. 

## 2022-07-08 NOTE — Assessment & Plan Note (Deleted)
ORDER LABS AND CT OF ABD/PELVIS.

## 2022-07-08 NOTE — Assessment & Plan Note (Addendum)
Asymptomatic. Reassurance given.

## 2022-07-19 ENCOUNTER — Ambulatory Visit
Admission: RE | Admit: 2022-07-19 | Discharge: 2022-07-19 | Disposition: A | Discharge status: Home / Self Care | Payer: Commercial Managed Care - PPO | Source: Ambulatory Visit | Attending: Family Medicine | Admitting: Family Medicine

## 2022-07-19 DIAGNOSIS — Z780 Asymptomatic menopausal state: Secondary | ICD-10-CM

## 2022-08-02 ENCOUNTER — Ambulatory Visit: Payer: Commercial Managed Care - PPO | Admitting: Dermatology

## 2022-08-17 ENCOUNTER — Other Ambulatory Visit: Payer: Self-pay | Admitting: Family Medicine

## 2022-08-17 DIAGNOSIS — E7801 Familial hypercholesterolemia: Secondary | ICD-10-CM

## 2022-10-04 ENCOUNTER — Ambulatory Visit (INDEPENDENT_AMBULATORY_CARE_PROVIDER_SITE_OTHER): Payer: Commercial Managed Care - PPO

## 2022-10-04 DIAGNOSIS — Z23 Encounter for immunization: Secondary | ICD-10-CM | POA: Diagnosis not present

## 2022-11-06 ENCOUNTER — Other Ambulatory Visit: Payer: Self-pay | Admitting: Family Medicine

## 2022-11-06 DIAGNOSIS — Z1231 Encounter for screening mammogram for malignant neoplasm of breast: Secondary | ICD-10-CM

## 2022-12-04 ENCOUNTER — Ambulatory Visit
Admission: RE | Admit: 2022-12-04 | Discharge: 2022-12-04 | Disposition: A | Discharge status: Home / Self Care | Payer: Commercial Managed Care - PPO | Source: Ambulatory Visit | Attending: Family Medicine | Admitting: Family Medicine

## 2022-12-04 DIAGNOSIS — Z1231 Encounter for screening mammogram for malignant neoplasm of breast: Secondary | ICD-10-CM

## 2022-12-24 ENCOUNTER — Other Ambulatory Visit: Payer: Self-pay | Admitting: Family Medicine

## 2022-12-24 DIAGNOSIS — E7801 Familial hypercholesterolemia: Secondary | ICD-10-CM

## 2023-01-20 NOTE — Progress Notes (Signed)
 Subjective:  Patient ID: Alicia Snow, female    DOB: [birth year]  Age: 68 y.o. MRN: [id number]  Chief Complaint  Patient presents with   Annual Exam    HPI Well Adult Physical: Patient here for a comprehensive physical exam.The patient reports no problems Do you take any herbs or supplements that were not prescribed by a doctor? no Are you taking calcium  supplements? no Are you taking aspirin daily? no  Encounter for general adult medical examination without abnormal findings  Physical (At Risk items are starred): Patient's last physical exam was 1 year ago .  Patient is not afflicted from Stress Incontinence and Urge Incontinence  Patient wears a seat belts Patient has smoke detectors and has carbon monoxide detectors. Patient practices appropriate gun safety. Patient wears sunscreen with extended sun exposure. Dental Care: biannual cleanings, brushes and flosses daily. Ophthalmology/Optometry: Annual visit.  Hearing loss: none Vision impairments: Reading  Low carb diet.  Patient was going to healthy wt and wellness and has    History of Present Illness The patient, with a history of psoriasis, diverticulitis, and CLL, presents for an annual physical. They report recent bereavement due to the loss of a sibling, for whom they were the primary caregiver. Despite the emotional toll, they deny needing an increase in their Prozac  dosage. They have been managing their grief.  The patient's psoriasis, diagnosed by a dermatologist, has resolved following a steroid injection. They have no new symptoms or medical issues.   The patient also mentions a history of a high C-reactive protein level and a strong family history of heart disease. They express interest in having their C-reactive protein level checked again.  Menarche: 68 y.o Menstrual History: reg LMP: s/p menopause Pregnancy history: 0 Safe at home: yes Self breast exams: Yes     01/21/2023    8:05 AM 07/04/2022    1:37 PM  01/18/2022    2:24 PM 01/05/2021    8:22 AM 01/04/2020    8:58 PM  Depression screen PHQ 2/9  Decreased Interest 0 0 0 0 0  Down, Depressed, Hopeless 0 0 0 0 0  PHQ - 2 Score 0 0 0 0 0  Altered sleeping 0   0   Tired, decreased energy 0   0   Change in appetite 0   0   Feeling bad or failure about yourself  0   0   Trouble concentrating 0   0   Moving slowly or fidgety/restless 0   0   Suicidal thoughts 0   0   PHQ-9 Score 0   0   Difficult doing work/chores Not difficult at all   Not difficult at all          02/14/2021    8:41 AM 12/05/2021   10:01 AM 01/18/2022    2:24 PM 07/04/2022    1:37 PM 01/21/2023    8:05 AM  Fall Risk  Falls in the past year?   0 0 0  Was there an injury with Fall?   0 0 0  Fall Risk Category Calculator   0 0 0  Fall Risk Category (Retired)   Low    (RETIRED) Patient Fall Risk Level Low fall risk Low fall risk Low fall risk    Patient at Risk for Falls Due to   No Fall Risks No Fall Risks No Fall Risks  Fall risk Follow up   Falls evaluation completed Follow up appointment Falls evaluation completed  Social Hx   Social History   Socioeconomic History   Marital status: Married    Spouse name: Not on file   Number of children: 0   Years of education: Not on file   Highest education level: Not on file  Occupational History   Occupation: para legal  Tobacco Use   Smoking status: Never   Smokeless tobacco: Never   Tobacco comments:    never used tobacco  Vaping Use   Vaping status: Never Used  Substance and Sexual Activity   Alcohol use: No    Alcohol/week: 0.0 standard drinks of alcohol   Drug use: No   Sexual activity: Not on file  Other Topics Concern   Not on file  Social History Narrative   Not on file   Social Drivers of Health   Financial Resource Strain: Low Risk  (01/18/2022)   Overall Financial Resource Strain (CARDIA)    Difficulty of Paying Living Expenses: Not hard at all  Food Insecurity: No Food  Insecurity (01/18/2022)   Hunger Vital Sign    Worried About Running Out of Food in the Last Year: Never true    Ran Out of Food in the Last Year: Never true  Transportation Needs: No Transportation Needs (01/18/2022)   PRAPARE - Administrator, Civil Service (Medical): No    Lack of Transportation (Non-Medical): No  Physical Activity: Sufficiently Active (01/18/2022)   Exercise Vital Sign    Days of Exercise per Week: 3 days    Minutes of Exercise per Session: 60 min  Stress: No Stress Concern Present (01/18/2022)   [employer] of Occupational Health - Occupational Stress Questionnaire    Feeling of Stress : Not at all  Social Connections: Moderately Isolated (01/18/2022)   Social Connection and Isolation Panel [NHANES]    Frequency of Communication with Friends and Family: Three times a week    Frequency of Social Gatherings with Friends and Family: Three times a week    Attends Religious Services: Never    Active Member of Clubs or Organizations: No    Attends Engineer, Structural: Never    Marital Status: Married   Past Medical History:  Diagnosis Date   Anal fissure    Anxiety    Back pain    CLL (chronic lymphoblastic leukemia)    Constipation    Depression    Diverticulosis    Hemorrhoids    Hyperlipidemia    IBS (irritable bowel syndrome)    Nephrolithiasis    Obesity    Psoriasis 2024   Past Surgical History:  Procedure Laterality Date   ANAL FISSURE REPAIR     excision of polyp and external anal tag     LITHOTRIPSY      Family History  Problem Relation Age of Onset   Breast cancer Mother 18       died of breast cancer   Cancer Mother    Heart disease Father    Hypertension Father    Hyperlipidemia Father    Diabetes Sister        x2   Diabetes Brother    Stroke Brother    Breast cancer Maternal Grandmother 42       died of breast cancer   Liver cancer Maternal Grandfather    Breast cancer Paternal Grandmother        died of  breast ca - age unknown   Heart disease Paternal Grandmother    Heart disease Paternal Grandfather  Colon cancer Neg Hx    Esophageal cancer Neg Hx    Rectal cancer Neg Hx    Colon polyps Neg Hx    Stomach cancer Neg Hx     Review of Systems  Constitutional:  Negative for chills, fatigue and fever.  HENT:  Negative for congestion, ear pain, rhinorrhea and sore throat.   Respiratory:  Negative for cough and shortness of breath.   Cardiovascular:  Negative for chest pain.  Gastrointestinal:  Negative for abdominal pain, constipation, diarrhea, nausea and vomiting.  Genitourinary:  Negative for dysuria and urgency.  Musculoskeletal:  Negative for back pain and myalgias.  Neurological:  Negative for dizziness, weakness, light-headedness and headaches.  Psychiatric/Behavioral:  Negative for dysphoric mood. The patient is not nervous/anxious.      Objective:  BP 132/76   Pulse 76   Temp (!) 97.2 F (36.2 C)   Ht 5' 2 (1.575 m)   Wt 202 lb (91.6 kg)   LMP 01/08/2006   SpO2 97%   BMI 36.95 kg/m      01/21/2023    8:02 AM 07/04/2022    1:31 PM 06/24/2022    8:34 PM  BP/Weight  Systolic BP 132 118   Diastolic BP 76 80   Wt. (Lbs) 202 197 198  BMI 36.95 kg/m2 36.03 kg/m2 36.21 kg/m2    Physical Exam Vitals reviewed.  Constitutional:      Appearance: Normal appearance. She is obese.  HENT:     Right Ear: Tympanic membrane normal.     Left Ear: Tympanic membrane normal.     Nose: Nose normal.     Mouth/Throat:     Pharynx: No oropharyngeal exudate or posterior oropharyngeal erythema.  Eyes:     Conjunctiva/sclera: Conjunctivae normal.  Neck:     Vascular: No carotid bruit.  Cardiovascular:     Rate and Rhythm: Normal rate and regular rhythm.     Heart sounds: Normal heart sounds.  Pulmonary:     Effort: Pulmonary effort is normal. No respiratory distress.     Breath sounds: Normal breath sounds.  Abdominal:     General: Abdomen is flat. Bowel sounds are normal.      Palpations: Abdomen is soft.     Tenderness: There is no abdominal tenderness.  Neurological:     Mental Status: She is alert and oriented to person, place, and time.  Psychiatric:        Mood and Affect: Mood normal.        Behavior: Behavior normal.     Lab Results  Component Value Date   WBC 20.5 (H) 06/24/2022   HGB 13.3 06/24/2022   HCT 40.3 06/24/2022   PLT 213 06/24/2022   GLUCOSE 114 (H) 06/24/2022   CHOL 154 01/19/2022   TRIG 102 01/19/2022   HDL 60 01/19/2022   LDLCALC 75 01/19/2022   ALT 21 06/24/2022   AST 22 06/24/2022   NA 135 06/24/2022   K 3.5 06/24/2022   CL 98 06/24/2022   CREATININE 1.00 06/24/2022   BUN 20 06/24/2022   CO2 26 06/24/2022   TSH 1.540 01/19/2022   HGBA1C 5.6 01/19/2022      Assessment & Plan:  Routine medical exam Assessment & Plan: Things to do to keep yourself healthy  - Exercise at least 30-45 minutes a day, 3-4 days a week.  - Eat a low-fat diet with lots of fruits and vegetables, up to 7-9 servings per day.  - Seatbelts can save your life.  Wear them always.  - Smoke detectors on every level of your home, check batteries every year.  - Eye Doctor - have an eye exam every 1-2 years  - Alcohol -  If you drink, do it moderately, less than 2 drinks per day.  - Health Care Power of Attorney. Choose someone to speak for you if you are not able.  - Depression is common in our stressful world.If you're feeling down or losing interest in things you normally enjoy, please come in for a visit.  - Violence - If anyone is threatening or hurting you, please call immediately.  Orders: -     CBC with Differential/Platelet -     Comprehensive metabolic panel -     TSH -     Lipid panel  Elevated glucose Assessment & Plan: Recommend continue to work on eating healthy diet and exercise. Check a1c  Orders: -     Hemoglobin A1c  Family history of chronic ischemic heart disease Assessment & Plan: Check cardiac crp.    Orders: -      High sensitivity CRP  Need for hepatitis C screening test Assessment & Plan: Check lab  Orders: -     HCV Ab w Reflex to Quant PCR  Chronic lymphocytic leukemia (HCC) Assessment & Plan: Management per specialist. Check cbc   Immunization due -     Pneumococcal conjugate vaccine 20-valent  Severe obesity with body mass index (BMI) of 36.0 to 36.9 with serious comorbidity (HCC) Assessment & Plan: Comorbities: hyperlipidemia, hyperglycemia.  Recommend continue to work on eating healthy diet and exercise.    Mixed hyperlipidemia Assessment & Plan: Well controlled.  No changes to medicines. Continue crestor  5 mg before bed.  Continue to work on eating a healthy diet and exercise.  Labs drawn today.      General Health Maintenance -Order blood work including CBC, liver and kidney panel, cholesterol, thyroid, A1C, and C-reactive protein. -Administer Prevnar 20 (pneumonia vaccine) today. -Perform Hepatitis C screening as part of routine care.  Follow-up in 1 year unless issues arise sooner.  Body mass index is 36.95 kg/m.   These are the goals we discussed:  Goals      Weight (lb) < 200 lb (90.7 kg)     Work on eating healthier and exercising.         This is a list of the screening recommended for you and due dates:  Health Maintenance  Topic Date Due   Hepatitis C Screening  Never done   COVID-19 Vaccine (7 - 2024-25 season) 11/29/2022   Mammogram  12/03/2024   DTaP/Tdap/Td vaccine (2 - Td or Tdap) 03/22/2027   Colon Cancer Screening  10/19/2030   Pneumonia Vaccine  Completed   Flu Shot  Completed   DEXA scan (bone density measurement)  Completed   Zoster (Shingles) Vaccine  Completed   HPV Vaccine  Aged Out     No orders of the defined types were placed in this encounter.   Follow-up: Return in about 1 year (around 01/21/2024) for cpe.  An After Visit Summary was printed and given to the patient.  Abigail Free, MD [facility] [phone]

## 2023-01-21 ENCOUNTER — Encounter: Payer: Self-pay | Admitting: Family Medicine

## 2023-01-21 ENCOUNTER — Ambulatory Visit (INDEPENDENT_AMBULATORY_CARE_PROVIDER_SITE_OTHER): Payer: Commercial Managed Care - PPO | Admitting: Family Medicine

## 2023-01-21 VITALS — BP 132/76 | HR 76 | Temp 97.2°F | Ht 62.0 in | Wt 202.0 lb

## 2023-01-21 DIAGNOSIS — Z23 Encounter for immunization: Secondary | ICD-10-CM | POA: Diagnosis not present

## 2023-01-21 DIAGNOSIS — R7309 Other abnormal glucose: Secondary | ICD-10-CM

## 2023-01-21 DIAGNOSIS — C9110 Chronic lymphocytic leukemia of B-cell type not having achieved remission: Secondary | ICD-10-CM

## 2023-01-21 DIAGNOSIS — E782 Mixed hyperlipidemia: Secondary | ICD-10-CM

## 2023-01-21 DIAGNOSIS — Z8249 Family history of ischemic heart disease and other diseases of the circulatory system: Secondary | ICD-10-CM

## 2023-01-21 DIAGNOSIS — Z1159 Encounter for screening for other viral diseases: Secondary | ICD-10-CM | POA: Insufficient documentation

## 2023-01-21 DIAGNOSIS — Z0000 Encounter for general adult medical examination without abnormal findings: Secondary | ICD-10-CM | POA: Diagnosis not present

## 2023-01-21 DIAGNOSIS — Z6836 Body mass index (BMI) 36.0-36.9, adult: Secondary | ICD-10-CM

## 2023-01-21 HISTORY — DX: Morbid (severe) obesity due to excess calories: E66.01

## 2023-01-21 HISTORY — DX: Other abnormal glucose: R73.09

## 2023-01-21 HISTORY — DX: Mixed hyperlipidemia: E78.2

## 2023-01-21 HISTORY — DX: Family history of ischemic heart disease and other diseases of the circulatory system: Z82.49

## 2023-01-21 NOTE — Assessment & Plan Note (Signed)
 Management per specialist. Check cbc

## 2023-01-21 NOTE — Assessment & Plan Note (Signed)
 Things to do to keep yourself healthy  - Exercise at least 30-45 minutes a day, 3-4 days a week.  - Eat a low-fat diet with lots of fruits and vegetables, up to 7-9 servings per day.  - Seatbelts can save your life. Wear them always.  - Smoke detectors on every level of your home, check batteries every year.  - Eye Doctor - have an eye exam every 1-2 years   - Alcohol -  If you drink, do it moderately, less than 2 drinks per day.  - Health Care Power of Attorney. Choose someone to speak for you if you are not able.  - Depression is common in our stressful world.If you're feeling down or losing interest in things you normally enjoy, please come in for a visit.  - Violence - If anyone is threatening or hurting you, please call immediately.

## 2023-01-21 NOTE — Assessment & Plan Note (Signed)
 Check lab

## 2023-01-21 NOTE — Assessment & Plan Note (Signed)
 Well controlled.  ?No changes to medicines. Continue crestor 5 mg before bed  ?Continue to work on eating a healthy diet and exercise.  ?Labs drawn today.  ?

## 2023-01-21 NOTE — Patient Instructions (Signed)
 Things to do to keep yourself healthy  - Exercise at least 30-45 minutes a day, 3-4 days a week.  - Eat a low-fat diet with lots of fruits and vegetables, up to 7-9 servings per day.  - Seatbelts can save your life. Wear them always.  - Smoke detectors on every level of your home, check batteries every year.  - Eye Doctor - have an eye exam every 1-2 years   - Alcohol -  If you drink, do it moderately, less than 2 drinks per day.  - Health Care Power of Attorney. Choose someone to speak for you if you are not able.  - Depression is common in our stressful world.If you're feeling down or losing interest in things you normally enjoy, please come in for a visit.  - Violence - If anyone is threatening or hurting you, please call immediately.

## 2023-01-21 NOTE — Assessment & Plan Note (Signed)
 Recommend continue to work on eating healthy diet and exercise. Check a1c

## 2023-01-21 NOTE — Assessment & Plan Note (Signed)
 Check cardiac crp.

## 2023-01-21 NOTE — Assessment & Plan Note (Addendum)
 Comorbities: hyperlipidemia, hyperglycemia.  Recommend continue to work on eating healthy diet and exercise.

## 2023-01-22 LAB — COMPREHENSIVE METABOLIC PANEL
ALT: 18 [IU]/L (ref 0–32)
AST: 21 [IU]/L (ref 0–40)
Albumin: 4.8 g/dL (ref 3.9–4.9)
Alkaline Phosphatase: 88 [IU]/L (ref 44–121)
BUN/Creatinine Ratio: 22 (ref 12–28)
BUN: 21 mg/dL (ref 8–27)
Bilirubin Total: 0.4 mg/dL (ref 0.0–1.2)
CO2: 21 mmol/L (ref 20–29)
Calcium: 10 mg/dL (ref 8.7–10.3)
Chloride: 103 mmol/L (ref 96–106)
Creatinine, Ser: 0.94 mg/dL (ref 0.57–1.00)
Globulin, Total: 1.8 g/dL (ref 1.5–4.5)
Glucose: 93 mg/dL (ref 70–99)
Potassium: 5.3 mmol/L — ABNORMAL HIGH (ref 3.5–5.2)
Sodium: 142 mmol/L (ref 134–144)
Total Protein: 6.6 g/dL (ref 6.0–8.5)
eGFR: 67 mL/min/{1.73_m2} (ref >59)

## 2023-01-22 LAB — CBC WITH DIFFERENTIAL/PLATELET
Basophils Absolute: 0.1 10*3/uL (ref 0.0–0.2)
Basos: 0 %
EOS (ABSOLUTE): 0.1 10*3/uL (ref 0.0–0.4)
Eos: 1 %
Hematocrit: 40.1 % (ref 34.0–46.6)
Hemoglobin: 13.5 g/dL (ref 11.1–15.9)
Immature Grans (Abs): 0 10*3/uL (ref 0.0–0.1)
Immature Granulocytes: 0 %
Lymphocytes Absolute: 12.9 10*3/uL — ABNORMAL HIGH (ref 0.7–3.1)
Lymphs: 72 %
MCH: 30 pg (ref 26.6–33.0)
MCHC: 33.7 g/dL (ref 31.5–35.7)
MCV: 89 fL (ref 79–97)
Monocytes Absolute: 0.5 10*3/uL (ref 0.1–0.9)
Monocytes: 3 %
Neutrophils Absolute: 4.4 10*3/uL (ref 1.4–7.0)
Neutrophils: 24 %
Platelets: 230 10*3/uL (ref 150–450)
RBC: 4.5 x10E6/uL (ref 3.77–5.28)
RDW: 12.6 % (ref 11.7–15.4)
WBC: 18 10*3/uL — ABNORMAL HIGH (ref 3.4–10.8)

## 2023-01-22 LAB — LIPID PANEL
Chol/HDL Ratio: 2.2 {ratio} (ref 0.0–4.4)
Cholesterol, Total: 166 mg/dL (ref 100–199)
HDL: 75 mg/dL (ref >39)
LDL Chol Calc (NIH): 79 mg/dL (ref 0–99)
Triglycerides: 63 mg/dL (ref 0–149)
VLDL Cholesterol Cal: 12 mg/dL (ref 5–40)

## 2023-01-22 LAB — HEMOGLOBIN A1C
Est. average glucose Bld gHb Est-mCnc: 108 mg/dL
Hgb A1c MFr Bld: 5.4 % (ref 4.8–5.6)

## 2023-01-22 LAB — TSH: TSH: 2.28 u[IU]/mL (ref 0.450–4.500)

## 2023-01-22 LAB — HIGH SENSITIVITY CRP: CRP, High Sensitivity: 3.71 mg/L — ABNORMAL HIGH (ref 0.00–3.00)

## 2023-01-23 ENCOUNTER — Telehealth: Payer: Self-pay

## 2023-01-23 ENCOUNTER — Other Ambulatory Visit: Payer: Self-pay

## 2023-01-23 DIAGNOSIS — R899 Unspecified abnormal finding in specimens from other organs, systems and tissues: Secondary | ICD-10-CM

## 2023-01-23 DIAGNOSIS — Z8249 Family history of ischemic heart disease and other diseases of the circulatory system: Secondary | ICD-10-CM

## 2023-01-23 LAB — HCV INTERPRETATION

## 2023-01-23 LAB — HCV AB W REFLEX TO QUANT PCR: HCV Ab: NONREACTIVE

## 2023-01-23 NOTE — Telephone Encounter (Signed)
Referral sent to cardiology

## 2023-01-23 NOTE — Telephone Encounter (Signed)
 Copied from CRM [id number]. Topic: Clinical - Lab/Test Results >> Jan 23, 2023  8:31 AM Alicia Snow wrote: Reason for CRM: patient just received a call about blood work . Relayed results to patient . And she would like to proceed with the cardiology referral.

## 2023-01-28 ENCOUNTER — Other Ambulatory Visit: Payer: Self-pay

## 2023-01-28 DIAGNOSIS — N200 Calculus of kidney: Secondary | ICD-10-CM | POA: Insufficient documentation

## 2023-01-28 DIAGNOSIS — F32A Depression, unspecified: Secondary | ICD-10-CM | POA: Insufficient documentation

## 2023-01-28 DIAGNOSIS — M549 Dorsalgia, unspecified: Secondary | ICD-10-CM | POA: Insufficient documentation

## 2023-01-28 DIAGNOSIS — K5900 Constipation, unspecified: Secondary | ICD-10-CM | POA: Insufficient documentation

## 2023-01-28 DIAGNOSIS — K5790 Diverticulosis of intestine, part unspecified, without perforation or abscess without bleeding: Secondary | ICD-10-CM | POA: Insufficient documentation

## 2023-01-28 DIAGNOSIS — E785 Hyperlipidemia, unspecified: Secondary | ICD-10-CM | POA: Insufficient documentation

## 2023-01-28 DIAGNOSIS — F419 Anxiety disorder, unspecified: Secondary | ICD-10-CM | POA: Insufficient documentation

## 2023-01-28 DIAGNOSIS — E669 Obesity, unspecified: Secondary | ICD-10-CM | POA: Insufficient documentation

## 2023-01-31 ENCOUNTER — Ambulatory Visit: Payer: Commercial Managed Care - PPO

## 2023-01-31 VITALS — BP 128/76 | HR 66 | Ht 62.0 in | Wt 200.8 lb

## 2023-01-31 DIAGNOSIS — E782 Mixed hyperlipidemia: Secondary | ICD-10-CM | POA: Diagnosis not present

## 2023-01-31 DIAGNOSIS — R7982 Elevated C-reactive protein (CRP): Secondary | ICD-10-CM

## 2023-01-31 MED ORDER — ROSUVASTATIN CALCIUM 10 MG PO TABS: 10.0000 mg | ORAL_TABLET | Freq: Every day | ORAL | 3 refills | Status: DC

## 2023-01-31 NOTE — Assessment & Plan Note (Signed)
Discussed elevated high-sensitivity C-reactive protein levels as markers for inflammation. She does have conditions which can do this chronically given her underlying CLL and psoriasis. Chronic inflammation in the body as potential cardiovascular risk discussed.  Interventions to reduce the overall cardiovascular risk due to this was discussed. Lifestyle modifications by regular exercise, weight manage treatment discussed. She plans to follow-up closely with [facility] weight management plan to work on weight reduction.  Lipid-lowering therapy with statins in addition to lowering cholesterol levels have shown to reduce levels of CRP due to their anti-inflammatory effects. I would recommend continuing her current medication Crestor, titrate up the dose to 10 mg once daily.  At this time given that she lacks any ongoing cardiovascular symptoms, further testing is not recommended. She can continue to follow-up with her PCP and see [place] on an as-needed basis.

## 2023-01-31 NOTE — Assessment & Plan Note (Signed)
Well-controlled on recent lipid panel from 01-21-2023 total cholesterol 166, triglycerides 63, HDL 75, LDL 79.  Continue Crestor, titrating up the dose to 10 mg once daily given the high-sensitivity C-reactive protein levels.

## 2023-01-31 NOTE — Patient Instructions (Addendum)
Medication Instructions:   INCREASE: Crestor to 10mg  1 tablet daily- You may double your current dose and your next refill will reflect your new dose.   Lab Work: None Ordered If you have labs (blood work) drawn today and your tests are completely normal, you will receive your results only by: MyChart Message (if you have MyChart) OR A paper copy in the mail If you have any lab test that is abnormal or we need to change your treatment, we will call you to review the results.   Testing/Procedures: None Ordered   Follow-Up: At [facility], you and your health needs are our priority.  As part of our continuing mission to provide you with exceptional heart care, we have created designated Provider Care Teams.  These Care Teams include your primary Cardiologist (physician) and Advanced Practice Providers (APPs -  Physician Assistants and Nurse Practitioners) who all work together to provide you with the care you need, when you need it.  We recommend signing up for the patient portal called "MyChart".  Sign up information is provided on this After Visit Summary.  MyChart is used to connect with patients for Virtual Visits (Telemedicine).  Patients are able to view lab/test results, encounter notes, upcoming appointments, etc.  Non-urgent messages can be sent to your provider as well.   To learn more about what you can do with MyChart, go to [email or web address].    Your next appointment:   As needed  The format for your next appointment:   In Person  Provider:   Huntley Dec, MD   Other Instructions NA

## 2023-01-31 NOTE — Progress Notes (Signed)
Cardiology Consultation:    Date:  01/31/2023   ID:  Alicia Snow, Alicia Snow 12-23-1955, MRN [id number]  PCP:  Alicia Ohara, MD  Cardiologist:  Alicia Corporal Wylder Macomber, MD   Referring MD: Alicia Ohara, MD   No chief complaint on file.    ASSESSMENT AND PLAN:   [name] 68 year old woman with history of chronic CLL, psoriasis, obesity, hyperlipidemia, family history for heart disease here for further cardiovascular risk assessment and evaluation in the setting of elevated high-sensitivity reactive protein levels measuring 3.7 mg/L.  Good baseline functional status and no active cardiac symptoms.  No prior cardiac history.   Problem List Items Addressed This Visit     Mixed hyperlipidemia - Primary   Well-controlled on recent lipid panel from 01-21-2023 total cholesterol 166, triglycerides 63, HDL 75, LDL 79.  Continue Crestor, titrating up the dose to 10 mg once daily given the high-sensitivity C-reactive protein levels.       Relevant Medications   rosuvastatin (CRESTOR) 10 MG tablet   Other Relevant Orders   EKG 12-Lead (Completed)   Elevated high sensitivity C-reactive protein   Discussed elevated high-sensitivity C-reactive protein levels as markers for inflammation. She does have conditions which can do this chronically given her underlying CLL and psoriasis. Chronic inflammation in the body as potential cardiovascular risk discussed.  Interventions to reduce the overall cardiovascular risk due to this was discussed. Lifestyle modifications by regular exercise, weight manage treatment discussed. She plans to follow-up closely with [facility] weight management plan to work on weight reduction.  Lipid-lowering therapy with statins in addition to lowering cholesterol levels have shown to reduce levels of CRP due to their anti-inflammatory effects. I would recommend continuing her current medication Crestor, titrate up the dose to 10 mg once daily.  At this time given that she  lacks any ongoing cardiovascular symptoms, further testing is not recommended. She can continue to follow-up with her PCP and see [place] on an as-needed basis.      Return to clinic for follow-up as needed.   History of Present Illness:    Alicia Snow is a 68 y.o. female who is being seen today for the evaluation of cardiovascular risk factors including family history and elevated high-sensitivity C-reactive protein at the request of Cox, Kirsten, MD.   Has a history of psoriasis(diagnosed in the past year), CLL (diagnosed 15 years ago, follows closely with oncologist in [facility] not on any specific therapy at this point), obesity, hyperlipidemia and reported having significant family history for heart disease (father with heart disease in his 18s) and previous workup noted elevated high-sensitivityC reactive Protein levels.  Very pleasant woman here for the visit by herself.  Lives at home with her husband of over 38 years.  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  Keeps herself busy at home with her activities and regularly exercises walking up to 3 miles multiple times a week.  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  She does not have children.  Denies any ongoing cardiac symptoms.  No chest pain, shortness of breath, orthopnea.  Does not smoke.  Does not drink alcohol. No recreational drug use. Drinks diet sodas.  Has been taking Crestor 5 mg for multiple years and tolerating well.  Continues to use co-Q10 and probiotics.  EKG today in the clinic shows sinus rhythm heart rate 66/min, normal PR interval  126 ms, RSR prime pattern in V1 V2 QRS duration 100 ms suggest incomplete RBBB morphology, QTc normal 470 ms.  In comparison prior EKG from 08/25/2015 was similar.  Blood work from 01-21-2023 C-reactive protein was again noted to be elevated 3.7 Hemoglobin A1c 5.4. Lipid panel with total cholesterol 166, triglycerides  63, HDL 75, LDL 79 Thyroid panel with TSH 2.2   Past Medical History:  Diagnosis Date   Anxiety    Asymptomatic microscopic hematuria 01/05/2021   Back pain    Calculus of gallbladder without cholecystitis without obstruction 07/08/2022   Chronic lymphocytic leukemia (HCC) 11/22/2008   Qualifier: Diagnosis of   By: Juanda Chance MD, Hedwig Morton        Constipation    Depression    Diverticulitis 07/08/2022   Diverticulosis    Elevated glucose 01/21/2023   Encounter for osteoporosis screening in asymptomatic postmenopausal patient 01/28/2022   Family history of chronic ischemic heart disease 01/21/2023   Hyperlipidemia    Insulin resistance 10/08/2019   IRRITABLE BOWEL SYNDROME 02/20/2007   Qualifier: Diagnosis of   By: Vear Clock CMA (AAMA), Stephanie         Mixed hyperlipidemia 01/21/2023   Nephrolithiasis    NEPHROLITHIASIS, HX OF 06/09/2007   Qualifier: Diagnosis of   By: Candice Camp CMA (AAMA), Dottie         Obesity    Psoriasis 2024   Routine medical exam 01/27/2022   Severe obesity with body mass index (BMI) of 36.0 to 36.9 with serious comorbidity (HCC) 01/21/2023    Past Surgical History:  Procedure Laterality Date   ANAL FISSURE REPAIR     excision of polyp and external anal tag     LITHOTRIPSY      Current Medications: Current Meds  Medication Sig   co-enzyme Q-10 30 MG capsule Take 200 mg by mouth daily.    FLUoxetine (PROZAC) 20 MG capsule Take 1 capsule by mouth once daily   Multiple Vitamins-Minerals (CENTRUM SILVER ULTRA WOMENS) TABS Take 1 tablet by mouth daily.    Probiotic Product (ALIGN) 4 MG CAPS Take 1 capsule by mouth daily.    rosuvastatin (CRESTOR) 10 MG tablet Take 1 tablet (10 mg total) by mouth daily.   VITAMIN D, ERGOCALCIFEROL, PO Take 5,000 Units by mouth daily.   [DISCONTINUED] rosuvastatin (CRESTOR) 5 MG tablet TAKE 1 TABLET BY MOUTH ONCE DAILY AT BEDTIME     Allergies:   Nitrofurantoin   Social History   Socioeconomic History   Marital  status: Married    Spouse name: Not on file   Number of children: 0   Years of education: Not on file   Highest education level: Not on file  Occupational History   Occupation: para legal  Tobacco Use   Smoking status: Never   Smokeless tobacco: Never   Tobacco comments:    never used tobacco  Vaping Use   Vaping status: Never Used  Substance and Sexual Activity   Alcohol use: No    Alcohol/week: 0.0 standard drinks of alcohol   Drug use: No   Sexual activity: Not on file  Other Topics Concern   Not on file  Social History Narrative   Not on file   Social Drivers of Health   Financial Resource Strain: Low Risk  (01/18/2022)   Overall Financial Resource Strain (CARDIA)    Difficulty of Paying Living Expenses: Not hard at all  Food Insecurity: No Food Insecurity (01/18/2022)   Hunger Vital Sign    Worried About  Running Out of Food in the Last Year: Never true    Ran Out of Food in the Last Year: Never true  Transportation Needs: No Transportation Needs (01/18/2022)   PRAPARE - Administrator, Civil Service (Medical): No    Lack of Transportation (Non-Medical): No  Physical Activity: Sufficiently Active (01/18/2022)   Exercise Vital Sign    Days of Exercise per Week: 3 days    Minutes of Exercise per Session: 60 min  Stress: No Stress Concern Present (01/18/2022)   [employer] of Occupational Health - Occupational Stress Questionnaire    Feeling of Stress : Not at all  Social Connections: Moderately Isolated (01/18/2022)   Social Connection and Isolation Panel [NHANES]    Frequency of Communication with Friends and Family: Three times a week    Frequency of Social Gatherings with Friends and Family: Three times a week    Attends Religious Services: Never    Active Member of Clubs or Organizations: No    Attends Engineer, structural: Never    Marital Status: Married     Family History: The patient's family history includes Breast cancer in her  paternal grandmother; Breast cancer (age of onset: 36) in her maternal grandmother; Breast cancer (age of onset: 4) in her mother; Cancer in her mother; Diabetes in her brother and sister; Heart disease in her father, paternal grandfather, and paternal grandmother; Hyperlipidemia in her father; Hypertension in her father; Liver cancer in her maternal grandfather; Stroke in her brother. There is no history of Colon cancer, Esophageal cancer, Rectal cancer, Colon polyps, or Stomach cancer. ROS:   Please see the history of present illness.    All 14 point review of systems negative except as described per history of present illness.  EKGs/Labs/Other Studies Reviewed:    The following studies were reviewed today:   EKG:  EKG Interpretation Date/Time:  Thursday January 31 2023 08:15:36 EST Ventricular Rate:  66 PR Interval:  126 QRS Duration:  100 QT Interval:  398 QTC Calculation: 417 R Axis:   44  Text Interpretation: Normal sinus rhythm Incomplete right bundle branch block Borderline ECG When compared with ECG of 25-May-2015 13:09, PREVIOUS ECG IS PRESENT Confirmed by Huntley Dec reddy [phone]) on 01/31/2023 8:41:49 AM    Recent Labs: 01/21/2023: ALT 18; BUN 21; Creatinine, Ser 0.94; Hemoglobin 13.5; Platelets 230; Potassium 5.3; Sodium 142; TSH 2.280  Recent Lipid Panel    Component Value Date/Time   CHOL 166 01/21/2023 0842   TRIG 63 01/21/2023 0842   TRIG 117 11/07/2015 1130   HDL 75 01/21/2023 0842   CHOLHDL 2.2 01/21/2023 0842   LDLCALC 79 01/21/2023 0842    Physical Exam:    VS:  BP 128/76   Pulse 66   Ht 5\' 2"  (1.575 m)   Wt 200 lb 12.8 oz (91.1 kg)   LMP 01/08/2006   SpO2 98%   BMI 36.73 kg/m     Wt Readings from Last 3 Encounters:  01/31/23 200 lb 12.8 oz (91.1 kg)  01/21/23 202 lb (91.6 kg)  07/04/22 197 lb (89.4 kg)     GENERAL:  Well nourished, well developed in no acute distress NECK: No JVD; No carotid bruits CARDIAC: RRR, S1 and S2 present, no  murmurs, no rubs, no gallops CHEST:  Clear to auscultation without rales, wheezing or rhonchi  Extremities: No pitting pedal edema. Pulses bilaterally symmetric with radial 2+ and dorsalis pedis 2+ NEUROLOGIC:  Alert and oriented x 3  Medication Adjustments/Labs and Tests Ordered: Current medicines are reviewed at length with the patient today.  Concerns regarding medicines are outlined above.  Orders Placed This Encounter  Procedures   EKG 12-Lead   Meds ordered this encounter  Medications   rosuvastatin (CRESTOR) 10 MG tablet    Sig: Take 1 tablet (10 mg total) by mouth daily.    Dispense:  90 tablet    Refill:  3    Signed, Minnie Shi reddy Jamariya Davidoff, MD, MPH, [facility]. 01/31/2023 9:07 AM    [facility]

## 2023-02-12 ENCOUNTER — Inpatient Hospital Stay: Payer: Commercial Managed Care - PPO | Admitting: Hematology & Oncology

## 2023-02-12 ENCOUNTER — Inpatient Hospital Stay: Payer: Commercial Managed Care - PPO | Attending: Hematology & Oncology

## 2023-02-12 ENCOUNTER — Other Ambulatory Visit: Payer: Self-pay

## 2023-02-12 ENCOUNTER — Encounter: Payer: Self-pay | Admitting: Hematology & Oncology

## 2023-02-12 VITALS — BP 132/64 | HR 71 | Temp 98.3°F | Resp 18 | Ht 62.0 in | Wt 198.0 lb

## 2023-02-12 DIAGNOSIS — C9110 Chronic lymphocytic leukemia of B-cell type not having achieved remission: Secondary | ICD-10-CM | POA: Insufficient documentation

## 2023-02-12 DIAGNOSIS — R197 Diarrhea, unspecified: Secondary | ICD-10-CM | POA: Diagnosis not present

## 2023-02-12 DIAGNOSIS — Z79899 Other long term (current) drug therapy: Secondary | ICD-10-CM | POA: Insufficient documentation

## 2023-02-12 DIAGNOSIS — Z881 Allergy status to other antibiotic agents status: Secondary | ICD-10-CM | POA: Insufficient documentation

## 2023-02-12 LAB — CBC WITH DIFFERENTIAL (CANCER CENTER ONLY)
Abs Immature Granulocytes: 0.03 10*3/uL (ref 0.00–0.07)
Basophils Absolute: 0.1 10*3/uL (ref 0.0–0.1)
Basophils Relative: 1 %
Eosinophils Absolute: 0.2 10*3/uL (ref 0.0–0.5)
Eosinophils Relative: 1 %
HCT: 39.8 % (ref 36.0–46.0)
Hemoglobin: 13.2 g/dL (ref 12.0–15.0)
Immature Granulocytes: 0 %
Lymphocytes Relative: 69 %
Lymphs Abs: 11.5 10*3/uL — ABNORMAL HIGH (ref 0.7–4.0)
MCH: 29.7 pg (ref 26.0–34.0)
MCHC: 33.2 g/dL (ref 30.0–36.0)
MCV: 89.6 fL (ref 80.0–100.0)
Monocytes Absolute: 0.6 10*3/uL (ref 0.1–1.0)
Monocytes Relative: 4 %
Neutro Abs: 4.2 10*3/uL (ref 1.7–7.7)
Neutrophils Relative %: 25 %
Platelet Count: 228 10*3/uL (ref 150–400)
RBC: 4.44 MIL/uL (ref 3.87–5.11)
RDW: 13.2 % (ref 11.5–15.5)
Smear Review: NORMAL
WBC Count: 16.6 10*3/uL — ABNORMAL HIGH (ref 4.0–10.5)
nRBC: 0.1 % (ref 0.0–0.2)

## 2023-02-12 LAB — CMP (CANCER CENTER ONLY)
ALT: 18 U/L (ref 0–44)
AST: 18 U/L (ref 15–41)
Albumin: 4.6 g/dL (ref 3.5–5.0)
Alkaline Phosphatase: 60 U/L (ref 38–126)
Anion gap: 11 (ref 5–15)
BUN: 20 mg/dL (ref 8–23)
CO2: 27 mmol/L (ref 22–32)
Calcium: 9.5 mg/dL (ref 8.9–10.3)
Chloride: 104 mmol/L (ref 98–111)
Creatinine: 0.96 mg/dL (ref 0.44–1.00)
GFR, Estimated: >60 mL/min (ref >60)
Glucose, Bld: 95 mg/dL (ref 70–99)
Potassium: 4.2 mmol/L (ref 3.5–5.1)
Sodium: 142 mmol/L (ref 135–145)
Total Bilirubin: 0.5 mg/dL (ref 0.0–1.2)
Total Protein: 6.7 g/dL (ref 6.5–8.1)

## 2023-02-12 LAB — SAVE SMEAR(SSMR), FOR PROVIDER SLIDE REVIEW

## 2023-02-12 LAB — LACTATE DEHYDROGENASE: LDH: 174 U/L (ref 98–192)

## 2023-02-12 NOTE — Progress Notes (Signed)
 Hematology and Oncology Follow Up Visit  Alicia Snow [id number] 1955-03-02 68 y.o. 02/12/2023   Principle Diagnosis:  Stage A CLL  Current Therapy:   Observation     Interim History:  Alicia Snow is back for followup.  We actually saw her a year ago.  As always, we see her right for her birthday.  She has a Valentine's Day birthday.  She is excited about this.  She will be 68 years old.  She is still working.  She is trying to lose some weight.  She tried exercise.  She has had no problems outside of some occasional diverticulitis.  She is followed for this by her family doctor.  She has had no issues with fever.  She has had no cough.  She has had no problems with COVID.  There is been no change in bowel or bladder habits.  I guess when she has the diverticulitis, she has a little bit of diarrhea.  She has had no swollen lymph nodes.  She has had no problems with rashes.  Her last mammogram I think was back in November.  Overall, I will have to say that her performance status is probably ECOG 0.   Medications:  Current Outpatient Medications:    nystatin cream (MYCOSTATIN), Apply 1 Application topically 2 (two) times daily., Disp: , Rfl:    triamcinolone cream (KENALOG) 0.1 %, Apply topically 2 (two) times daily., Disp: , Rfl:    co-enzyme Q-10 30 MG capsule, Take 200 mg by mouth daily. , Disp: , Rfl:    FLUoxetine  (PROZAC ) 20 MG capsule, Take 1 capsule by mouth once daily, Disp: 90 capsule, Rfl: 0   Multiple Vitamins-Minerals (CENTRUM SILVER ULTRA WOMENS) TABS, Take 1 tablet by mouth daily. , Disp: , Rfl:    Probiotic Product (ALIGN) 4 MG CAPS, Take 1 capsule by mouth daily. , Disp: , Rfl:    rosuvastatin  (CRESTOR ) 10 MG tablet, Take 1 tablet (10 mg total) by mouth daily., Disp: 90 tablet, Rfl: 3   VITAMIN D , ERGOCALCIFEROL , PO, Take 5,000 Units by mouth daily., Disp: , Rfl:   Allergies:  Allergies  Allergen Reactions   Nitrofurantoin Hives, Diarrhea and Rash    Past Medical  History, Surgical history, Social history, and Family History were reviewed and updated.  Review of Systems: Review of Systems  Constitutional: Negative.   HENT: Negative.    Eyes: Negative.   Respiratory: Negative.    Cardiovascular: Negative.   Gastrointestinal: Negative.   Genitourinary: Negative.   Musculoskeletal: Negative.   Skin: Negative.   Neurological: Negative.   Endo/Heme/Allergies: Negative.   Psychiatric/Behavioral: Negative.      Physical Exam:  height is 5' 2 (1.575 m) and weight is 198 lb (89.8 kg). Her oral temperature is 98.3 F (36.8 C). Her blood pressure is 132/64 and her pulse is 71. Her respiration is 18 and oxygen saturation is 99%.   Physical Exam Vitals reviewed.  HENT:     Head: Normocephalic and atraumatic.  Eyes:     Pupils: Pupils are equal, round, and reactive to light.  Cardiovascular:     Rate and Rhythm: Normal rate and regular rhythm.     Heart sounds: Normal heart sounds.  Pulmonary:     Effort: Pulmonary effort is normal.     Breath sounds: Normal breath sounds.  Abdominal:     General: Bowel sounds are normal.     Palpations: Abdomen is soft.  Musculoskeletal:  General: No tenderness or deformity. Normal range of motion.     Cervical back: Normal range of motion.  Lymphadenopathy:     Cervical: No cervical adenopathy.  Skin:    General: Skin is warm and dry.     Findings: No erythema or rash.  Neurological:     Mental Status: She is alert and oriented to person, place, and time.  Psychiatric:        Behavior: Behavior normal.        Thought Content: Thought content normal.        Judgment: Judgment normal.    Lab Results  Component Value Date   WBC 16.6 (H) 02/12/2023   HGB 13.2 02/12/2023   HCT 39.8 02/12/2023   MCV 89.6 02/12/2023   PLT 228 02/12/2023     Chemistry      Component Value Date/Time   NA 142 01/21/2023 0842   NA 141 11/07/2015 1130   K 5.3 (H) 01/21/2023 0842   K 3.9 11/07/2015 1130   CL  103 01/21/2023 0842   CL 103 08/01/2015 0932   CO2 21 01/21/2023 0842   CO2 22 11/07/2015 1130   BUN 21 01/21/2023 0842   BUN 23.9 11/07/2015 1130   CREATININE 0.94 01/21/2023 0842   CREATININE 1.11 (H) 02/13/2022 0823   CREATININE 0.9 11/07/2015 1130      Component Value Date/Time   CALCIUM  10.0 01/21/2023 0842   CALCIUM  9.5 11/07/2015 1130   ALKPHOS 88 01/21/2023 0842   ALKPHOS 88 11/07/2015 1130   AST 21 01/21/2023 0842   AST 19 02/13/2022 0823   AST 19 11/07/2015 1130   ALT 18 01/21/2023 0842   ALT 19 02/13/2022 0823   ALT 21 11/07/2015 1130   BILITOT 0.4 01/21/2023 0842   BILITOT 0.6 02/13/2022 0823   BILITOT 0.68 11/07/2015 1130      Impression and Plan: Alicia Snow is a 68 year old white female with a CLL. Everything looks wonderful. I do not see any issues with the CLL. We've been following her now for 16 years.  In all honesty, she really has not changed at all.  She still looks as young as when we first saw her 16 years ago.  I know that she will have a wonderful birthday.  We will go ahead and plan to get her back in another year.  I just would be shocked if we ever had to treat her.  So far, there is been no indication that the CLL is active.   Alicia JONELLE Crease, MD 2/4/20258:52 AM

## 2023-03-07 ENCOUNTER — Encounter (HOSPITAL_BASED_OUTPATIENT_CLINIC_OR_DEPARTMENT_OTHER): Payer: Self-pay | Admitting: Emergency Medicine

## 2023-03-07 ENCOUNTER — Ambulatory Visit (HOSPITAL_BASED_OUTPATIENT_CLINIC_OR_DEPARTMENT_OTHER)
Admission: EM | Admit: 2023-03-07 | Discharge: 2023-03-07 | Disposition: A | Discharge status: Home / Self Care | Payer: Commercial Managed Care - PPO | Attending: Family Medicine | Admitting: Family Medicine

## 2023-03-07 DIAGNOSIS — R509 Fever, unspecified: Secondary | ICD-10-CM | POA: Diagnosis not present

## 2023-03-07 DIAGNOSIS — R051 Acute cough: Secondary | ICD-10-CM | POA: Diagnosis not present

## 2023-03-07 DIAGNOSIS — J069 Acute upper respiratory infection, unspecified: Secondary | ICD-10-CM | POA: Diagnosis not present

## 2023-03-07 LAB — POC COVID19/FLU A&B COMBO
Covid Antigen, POC: NEGATIVE
Influenza A Antigen, POC: NEGATIVE
Influenza B Antigen, POC: NEGATIVE

## 2023-03-07 MED ORDER — PROMETHAZINE-DM 6.25-15 MG/5ML PO SYRP: 5.0000 mL | ORAL_SOLUTION | Freq: Four times a day (QID) | ORAL | 0 refills | Status: DC | PRN

## 2023-03-07 NOTE — ED Triage Notes (Addendum)
 Pt reports on 2/5 had type A flu,never got rid of cough, ears started hurting, fever and sore throat started yesterday.

## 2023-03-07 NOTE — Discharge Instructions (Addendum)
 Negative for flu, COVID.  Most likely a viral upper respiratory infection.  Get plenty of fluids and rest.  Promethazine DM, 5 mL, every 6 hours as needed for cough.  Follow-up if symptoms do not improve, worsen or new symptoms occur.

## 2023-03-07 NOTE — ED Provider Notes (Signed)
 [facility]    CSN: [id number] Arrival date & time: 03/07/23  1907      History   Chief Complaint No chief complaint on file.   HPI Alicia Snow is a 68 y.o. female.   Patient reports that on 02/13/23, she had Influenza Type A.  Her cough has persisted.  Now she has new onset, ear aches, fever and sore throat that started yesterday (03/06/23).     Past Medical History:  Diagnosis Date   Anxiety    Asymptomatic microscopic hematuria 01/05/2021   Back pain    Calculus of gallbladder without cholecystitis without obstruction 07/08/2022   Chronic lymphocytic leukemia (HCC) 11/22/2008   Qualifier: Diagnosis of   By: Juanda Chance MD, Hedwig Morton        Constipation    Depression    Diverticulitis 07/08/2022   Diverticulosis    Elevated glucose 01/21/2023   Encounter for osteoporosis screening in asymptomatic postmenopausal patient 01/28/2022   Family history of chronic ischemic heart disease 01/21/2023   Hyperlipidemia    Insulin resistance 10/08/2019   IRRITABLE BOWEL SYNDROME 02/20/2007   Qualifier: Diagnosis of   By: Vear Clock CMA (AAMA), Stephanie         Mixed hyperlipidemia 01/21/2023   Nephrolithiasis    NEPHROLITHIASIS, HX OF 06/09/2007   Qualifier: Diagnosis of   By: Candice Camp CMA (AAMA), Dottie         Obesity    Psoriasis 2024   Routine medical exam 01/27/2022   Severe obesity with body mass index (BMI) of 36.0 to 36.9 with serious comorbidity (HCC) 01/21/2023    Patient Active Problem List   Diagnosis Date Noted   Elevated high sensitivity C-reactive protein 01/31/2023   Anxiety    Back pain    Constipation    Depression    Diverticulosis    Hyperlipidemia    Nephrolithiasis    Obesity    Elevated glucose 01/21/2023   Family history of chronic ischemic heart disease 01/21/2023   Need for hepatitis C screening test 01/21/2023   Immunization due 01/21/2023   Severe obesity with body mass index (BMI) of 36.0 to 36.9 with serious comorbidity (HCC)  01/21/2023   Mixed hyperlipidemia 01/21/2023   Diverticulitis 07/08/2022   Calculus of gallbladder without cholecystitis without obstruction 07/08/2022   Encounter for osteoporosis screening in asymptomatic postmenopausal patient 01/28/2022   Encounter for immunization 01/28/2022   Routine medical exam 01/27/2022   Psoriasis 2024   Asymptomatic microscopic hematuria 01/05/2021   Insulin resistance 10/08/2019   Chronic lymphocytic leukemia (HCC) 11/22/2008   NEPHROLITHIASIS, HX OF 06/09/2007   IRRITABLE BOWEL SYNDROME 02/20/2007    Past Surgical History:  Procedure Laterality Date   ANAL FISSURE REPAIR     excision of polyp and external anal tag     LITHOTRIPSY      OB History     Gravida  0   Para  0   Term  0   Preterm  0   AB  0   Living  0      SAB  0   IAB  0   Ectopic  0   Multiple  0   Live Births  0            Home Medications    Prior to Admission medications   Medication Sig Start Date End Date Taking? Authorizing Provider  promethazine-dextromethorphan (PROMETHAZINE-DM) 6.25-15 MG/5ML syrup Take 5 mLs by mouth 4 (four) times daily as needed. 03/07/23  Yes  Prescilla Sours, FNP  co-enzyme Q-10 30 MG capsule Take 200 mg by mouth daily.     [provider]  FLUoxetine (PROZAC) 20 MG capsule Take 1 capsule by mouth once daily 12/24/22   Cox, Kirsten, MD  Multiple Vitamins-Minerals (CENTRUM SILVER ULTRA WOMENS) TABS Take 1 tablet by mouth daily.     [provider]  nystatin cream (MYCOSTATIN) Apply 1 Application topically 2 (two) times daily. 02/07/23   [provider]  Probiotic Product (ALIGN) 4 MG CAPS Take 1 capsule by mouth daily.     [provider]  rosuvastatin (CRESTOR) 10 MG tablet Take 1 tablet (10 mg total) by mouth daily. 01/31/23 05/01/23  Madireddy, Marlyn Corporal, MD  triamcinolone cream (KENALOG) 0.1 % Apply topically 2 (two) times daily. 02/07/23   [provider]  VITAMIN D, ERGOCALCIFEROL, PO  Take 5,000 Units by mouth daily.    [provider]    Family History Family History  Problem Relation Age of Onset   Breast cancer Mother 40       died of breast cancer   Cancer Mother    Heart disease Father    Hypertension Father    Hyperlipidemia Father    Diabetes Sister        x2   Diabetes Brother    Stroke Brother    Breast cancer Maternal Grandmother 42       died of breast cancer   Liver cancer Maternal Grandfather    Breast cancer Paternal Grandmother        died of breast ca - age unknown   Heart disease Paternal Grandmother    Heart disease Paternal Grandfather    Colon cancer Neg Hx    Esophageal cancer Neg Hx    Rectal cancer Neg Hx    Colon polyps Neg Hx    Stomach cancer Neg Hx     Social History Social History   Tobacco Use   Smoking status: Never   Smokeless tobacco: Never   Tobacco comments:    never used tobacco  Vaping Use   Vaping status: Never Used  Substance Use Topics   Alcohol use: No    Alcohol/week: 0.0 standard drinks of alcohol   Drug use: No     Allergies   Nitrofurantoin   Review of Systems Review of Systems  Constitutional:  Positive for fever. Negative for chills.  HENT:  Positive for ear pain and sore throat.   Eyes:  Negative for pain and visual disturbance.  Respiratory:  Positive for cough. Negative for shortness of breath.   Cardiovascular:  Negative for chest pain and palpitations.  Gastrointestinal:  Negative for abdominal pain, constipation, diarrhea, nausea and vomiting.  Genitourinary:  Negative for dysuria and hematuria.  Musculoskeletal:  Negative for arthralgias and back pain.  Skin:  Negative for color change and rash.  Neurological:  Negative for seizures and syncope.  All other systems reviewed and are negative.    Physical Exam Triage Vital Signs ED Triage Vitals  Encounter Vitals Group     BP 03/07/23 1915 (!) 149/80     Systolic BP Percentile --      Diastolic BP Percentile --       Pulse Rate 03/07/23 1915 73     Resp 03/07/23 1915 18     Temp 03/07/23 1915 98.2 F (36.8 C)     Temp Source 03/07/23 1915 Oral     SpO2 03/07/23 1915 97 %     Weight --  Height --      Head Circumference --      Peak Flow --      Pain Score 03/07/23 1914 0     Pain Loc --      Pain Education --      Exclude from Growth Chart --    No data found.  Updated Vital Signs BP (!) 149/80 (BP Location: Right Arm)   Pulse 73   Temp 98.2 F (36.8 C) (Oral)   Resp 18   LMP 01/08/2006   SpO2 97%   Visual Acuity Right Eye Distance:   Left Eye Distance:   Bilateral Distance:    Right Eye Near:   Left Eye Near:    Bilateral Near:     Physical Exam Vitals and nursing note reviewed.  Constitutional:      General: She is not in acute distress.    Appearance: She is well-developed. She is not ill-appearing or toxic-appearing.  HENT:     Head: Normocephalic and atraumatic.     Right Ear: Hearing, tympanic membrane, ear canal and external ear normal.     Left Ear: Hearing, tympanic membrane, ear canal and external ear normal.     Nose: Congestion and rhinorrhea present. Rhinorrhea is clear.     Right Sinus: No maxillary sinus tenderness or frontal sinus tenderness.     Left Sinus: No maxillary sinus tenderness or frontal sinus tenderness.     Mouth/Throat:     Lips: Pink.     Mouth: Mucous membranes are moist.     Pharynx: Uvula midline. No oropharyngeal exudate or posterior oropharyngeal erythema.     Tonsils: No tonsillar exudate.  Eyes:     Conjunctiva/sclera: Conjunctivae normal.     Pupils: Pupils are equal, round, and reactive to light.  Cardiovascular:     Rate and Rhythm: Normal rate and regular rhythm.     Heart sounds: S1 normal and S2 normal. No murmur heard. Pulmonary:     Effort: Pulmonary effort is normal. No respiratory distress.     Breath sounds: Normal breath sounds. No decreased breath sounds, wheezing, rhonchi or rales.  Abdominal:     General: Bowel  sounds are normal.     Palpations: Abdomen is soft.     Tenderness: There is no abdominal tenderness.  Musculoskeletal:        General: No swelling.     Cervical back: Neck supple.  Lymphadenopathy:     Head:     Right side of head: No submental, submandibular, tonsillar, preauricular or posterior auricular adenopathy.     Left side of head: No submental, submandibular, tonsillar, preauricular or posterior auricular adenopathy.     Cervical: No cervical adenopathy.     Right cervical: No superficial cervical adenopathy.    Left cervical: No superficial cervical adenopathy.  Skin:    General: Skin is warm and dry.     Capillary Refill: Capillary refill takes less than 2 seconds.     Findings: No rash.  Neurological:     Mental Status: She is alert and oriented to person, place, and time.  Psychiatric:        Mood and Affect: Mood normal.      UC Treatments / Results  Labs (all labs ordered are listed, but only abnormal results are displayed) Labs Reviewed  POC COVID19/FLU A&B COMBO - Normal    EKG   Radiology No results found.  Procedures Procedures (including critical care time)  Medications Ordered in UC  Medications - No data to display  Initial Impression / Assessment and Plan / UC Course  I have reviewed the triage vital signs and the nursing notes.  Pertinent labs & imaging results that were available during my care of the patient were reviewed by me and considered in my medical decision making (see chart for details).     Negative for flu and COVID.  Viral upper respiratory infection: Promethazine DM, 5 mL, every 6 hours as needed for cough.  Get plenty of fluids and rest.  If symptoms do not improve, worsen or new symptoms occur. Final Clinical Impressions(s) / UC Diagnoses   Final diagnoses:  Fever, unspecified  Acute cough  Viral URI with cough     Discharge Instructions      Negative for flu, COVID.  Most likely a viral upper respiratory  infection.  Get plenty of fluids and rest.  Promethazine DM, 5 mL, every 6 hours as needed for cough.  Follow-up if symptoms do not improve, worsen or new symptoms occur.     ED Prescriptions     Medication Sig Dispense Auth. Provider   promethazine-dextromethorphan (PROMETHAZINE-DM) 6.25-15 MG/5ML syrup Take 5 mLs by mouth 4 (four) times daily as needed. 118 mL Prescilla Sours, FNP      PDMP not reviewed this encounter.   Prescilla Sours, FNP 03/07/23 2007

## 2023-03-11 ENCOUNTER — Encounter: Payer: Self-pay | Admitting: Family Medicine

## 2023-03-11 ENCOUNTER — Ambulatory Visit: Payer: Self-pay | Admitting: Family Medicine

## 2023-03-11 ENCOUNTER — Ambulatory Visit: Admitting: Family Medicine

## 2023-03-11 VITALS — BP 106/64 | HR 84 | Temp 97.7°F | Resp 16 | Ht 62.0 in | Wt 196.6 lb

## 2023-03-11 DIAGNOSIS — H66002 Acute suppurative otitis media without spontaneous rupture of ear drum, left ear: Secondary | ICD-10-CM | POA: Diagnosis not present

## 2023-03-11 MED ORDER — AMOXICILLIN-POT CLAVULANATE 875-125 MG PO TABS: 1.0000 | ORAL_TABLET | Freq: Two times a day (BID) | ORAL | 0 refills | Status: DC

## 2023-03-11 NOTE — Assessment & Plan Note (Signed)
 Sub acute Yellow congestion suggests bacterial component. Augmentin prescribed due to previous tolerance and effectiveness. - Prescribe Augmentin. - Advise increased fluid intake and warm teas or honey and lemon. - Recommend rest and good hand hygiene. - Instruct to discard toothbrush post-antibiotics. - Advise monitoring for breathing difficulties or need for chest x-ray if symptoms worsen.

## 2023-03-11 NOTE — Telephone Encounter (Signed)
 Chief Complaint: Cough  Symptoms: Hoarse, nasal congestion w/yellow mucus, cough yellow phlegm Frequency: Onset worsening last Wednesday Pertinent Negatives: Patient denies other symptoms Disposition: [] ED /[] Urgent Care (no appt availability in office) / [x] Appointment(In office/virtual)/ []  [place] [facility]/ [] Home Care/ [] Refused Recommended Disposition /[] [place] [facility]/ []  Follow-up with PCP Additional Notes: Patient says she was seen at the UC last Wednesdy and they cough is not better. She asks to be seen today by any provider and if not, she will go back to the UC. She says she was flu/COVID negative at the UC last Wednesday and at this point she feel she needs an antibiotic. Advised no availability with PCP today, scheduled with another provider.

## 2023-03-11 NOTE — Progress Notes (Signed)
 Acute Office Visit  Subjective:    Patient ID: Alicia Snow, female    DOB: [birth year], 68 y.o.   MRN: [id number]  Chief Complaint  Patient presents with   Nasal Congestion   Cough    Discussed the use of AI scribe software for clinical note transcription with the patient, who gave verbal consent to proceed.   HPI: Alicia Snow is a 68 year old female who presents with congestion and headache. Patient states symptoms started last Wednesday. Patient states she was seen at Urgent Care at the [facility] in [place]. She tested negative for Covid and Flu A and B. She was prescribed promethazine.   She has been experiencing congestion for five days, describing it as productive with yellow discharge. Initially, she had a fever and sore throat, which has resolved. She uses Aleve D every twelve hours, which temporarily alleviates the congestion.  Headaches occur occasionally, with some pain today. She has not taken specific medication for headaches aside from Aleve D.  Initially, she experienced ear pain, particularly the left ear, but this has gotten better.  No current fever, pain under her eyes, or wheezing. Any rattling in her chest clears with coughing. She has no history of asthma or COPD and does not use inhalers. She has been using promethazine for her cough.  She has been drinking plenty of fluids, including warm teas, to soothe her throat and plans to have soup when she gets home.    Past Medical History:  Diagnosis Date   Anxiety    Asymptomatic microscopic hematuria 01/05/2021   Back pain    Calculus of gallbladder without cholecystitis without obstruction 07/08/2022   Chronic lymphocytic leukemia (HCC) 11/22/2008   Qualifier: Diagnosis of   By: Juanda Chance MD, Hedwig Morton        Constipation    Depression    Diverticulitis 07/08/2022   Diverticulosis    Elevated glucose 01/21/2023   Encounter for osteoporosis screening in asymptomatic postmenopausal patient 01/28/2022   Family  history of chronic ischemic heart disease 01/21/2023   Hyperlipidemia    Insulin resistance 10/08/2019   IRRITABLE BOWEL SYNDROME 02/20/2007   Qualifier: Diagnosis of   By: Vear Clock CMA (AAMA), Stephanie         Mixed hyperlipidemia 01/21/2023   Nephrolithiasis    NEPHROLITHIASIS, HX OF 06/09/2007   Qualifier: Diagnosis of   By: Candice Camp CMA (AAMA), Dottie         Obesity    Psoriasis 2024   Routine medical exam 01/27/2022   Severe obesity with body mass index (BMI) of 36.0 to 36.9 with serious comorbidity (HCC) 01/21/2023    Past Surgical History:  Procedure Laterality Date   ANAL FISSURE REPAIR     excision of polyp and external anal tag     LITHOTRIPSY      Family History  Problem Relation Age of Onset   Breast cancer Mother 67       died of breast cancer   Cancer Mother    Heart disease Father    Hypertension Father    Hyperlipidemia Father    Diabetes Sister        x2   Diabetes Brother    Stroke Brother    Breast cancer Maternal Grandmother 42       died of breast cancer   Liver cancer Maternal Grandfather    Breast cancer Paternal Grandmother        died of breast ca - age unknown  Heart disease Paternal Grandmother    Heart disease Paternal Grandfather    Colon cancer Neg Hx    Esophageal cancer Neg Hx    Rectal cancer Neg Hx    Colon polyps Neg Hx    Stomach cancer Neg Hx     Social History   Socioeconomic History   Marital status: Married    Spouse name: Not on file   Number of children: 0   Years of education: Not on file   Highest education level: Not on file  Occupational History   Occupation: para legal  Tobacco Use   Smoking status: Never   Smokeless tobacco: Never   Tobacco comments:    never used tobacco  Vaping Use   Vaping status: Never Used  Substance and Sexual Activity   Alcohol use: No    Alcohol/week: 0.0 standard drinks of alcohol   Drug use: No   Sexual activity: Not on file  Other Topics Concern   Not on file   Social History Narrative   Not on file   Social Drivers of Health   Financial Resource Strain: Low Risk  (01/18/2022)   Overall Financial Resource Strain (CARDIA)    Difficulty of Paying Living Expenses: Not hard at all  Food Insecurity: No Food Insecurity (01/18/2022)   Hunger Vital Sign    Worried About Running Out of Food in the Last Year: Never true    Ran Out of Food in the Last Year: Never true  Transportation Needs: No Transportation Needs (01/18/2022)   PRAPARE - Administrator, Civil Service (Medical): No    Lack of Transportation (Non-Medical): No  Physical Activity: Sufficiently Active (01/18/2022)   Exercise Vital Sign    Days of Exercise per Week: 3 days    Minutes of Exercise per Session: 60 min  Stress: No Stress Concern Present (01/18/2022)   [employer] of Occupational Health - Occupational Stress Questionnaire    Feeling of Stress : Not at all  Social Connections: Moderately Isolated (01/18/2022)   Social Connection and Isolation Panel [NHANES]    Frequency of Communication with Friends and Family: Three times a week    Frequency of Social Gatherings with Friends and Family: Three times a week    Attends Religious Services: Never    Active Member of Clubs or Organizations: No    Attends Banker Meetings: Never    Marital Status: Married  Catering manager Violence: Not At Risk (01/18/2022)   Humiliation, Afraid, Rape, and Kick questionnaire    Fear of Current or Ex-Partner: No    Emotionally Abused: No    Physically Abused: No    Sexually Abused: No    Outpatient Medications Prior to Visit  Medication Sig Dispense Refill   co-enzyme Q-10 30 MG capsule Take 200 mg by mouth daily.      FLUoxetine (PROZAC) 20 MG capsule Take 1 capsule by mouth once daily 90 capsule 0   Multiple Vitamins-Minerals (CENTRUM SILVER ULTRA WOMENS) TABS Take 1 tablet by mouth daily.      nystatin cream (MYCOSTATIN) Apply 1 Application topically 2 (two) times  daily.     Probiotic Product (ALIGN) 4 MG CAPS Take 1 capsule by mouth daily.      promethazine-dextromethorphan (PROMETHAZINE-DM) 6.25-15 MG/5ML syrup Take 5 mLs by mouth 4 (four) times daily as needed. 118 mL 0   rosuvastatin (CRESTOR) 10 MG tablet Take 1 tablet (10 mg total) by mouth daily. 90 tablet 3   VITAMIN  D, ERGOCALCIFEROL, PO Take 5,000 Units by mouth daily.     triamcinolone cream (KENALOG) 0.1 % Apply topically 2 (two) times daily.     No facility-administered medications prior to visit.    Allergies  Allergen Reactions   Nitrofurantoin Hives, Diarrhea and Rash    Review of Systems  Constitutional:  Negative for chills, diaphoresis, fatigue and fever.  HENT:  Positive for congestion, ear pain (left), sinus pressure, sinus pain and sore throat.   Respiratory:  Positive for cough. Negative for shortness of breath.   Cardiovascular:  Negative for chest pain.  Gastrointestinal:  Negative for abdominal pain, constipation, diarrhea, nausea and vomiting.  Genitourinary:  Negative for dysuria.  Musculoskeletal:  Negative for arthralgias.  Neurological:  Positive for headaches (sometimes). Negative for weakness.  Psychiatric/Behavioral:  Negative for dysphoric mood. The patient is not nervous/anxious.        Objective:        03/11/2023    2:27 PM 03/07/2023    7:15 PM 02/12/2023    8:00 AM  Vitals with BMI  Height 5\' 2"   5\' 2"   Weight 196 lbs 10 oz  198 lbs  BMI 35.95  36.21  Systolic 106 149 161  Diastolic 64 80 64  Pulse 84 73 71    Orthostatic VS for the past 72 hrs (Last 3 readings):  Patient Position BP Location Cuff Size  03/11/23 1427 Sitting Left Arm Large     Physical Exam Vitals reviewed.  Constitutional:      General: She is not in acute distress.    Appearance: Normal appearance.  HENT:     Right Ear: Hearing and tympanic membrane normal.     Left Ear: Hearing normal. Tympanic membrane is erythematous.     Nose:     Right Sinus: Frontal sinus  tenderness present. No maxillary sinus tenderness.     Left Sinus: Frontal sinus tenderness present. No maxillary sinus tenderness.     Mouth/Throat:     Pharynx: No posterior oropharyngeal erythema.  Eyes:     Conjunctiva/sclera: Conjunctivae normal.  Cardiovascular:     Rate and Rhythm: Normal rate and regular rhythm.     Heart sounds: Normal heart sounds. No murmur heard. Pulmonary:     Effort: Pulmonary effort is normal.     Breath sounds: Rhonchi (clears with cough) present. No wheezing.  Abdominal:     General: Bowel sounds are normal.     Palpations: Abdomen is soft.     Tenderness: There is no abdominal tenderness.  Musculoskeletal:        General: Normal range of motion.     Cervical back: Normal range of motion.  Lymphadenopathy:     Cervical: No cervical adenopathy.  Skin:    General: Skin is warm.  Neurological:     Mental Status: She is alert. Mental status is at baseline.  Psychiatric:        Mood and Affect: Mood normal.        Behavior: Behavior normal.     Health Maintenance Due  Topic Date Due   COVID-19 Vaccine (7 - 2024-25 season) 11/29/2022    There are no preventive care reminders to display for this patient.   Lab Results  Component Value Date   TSH 2.280 01/21/2023   Lab Results  Component Value Date   WBC 16.6 (H) 02/12/2023   HGB 13.2 02/12/2023   HCT 39.8 02/12/2023   MCV 89.6 02/12/2023   PLT 228 02/12/2023  Lab Results  Component Value Date   NA 142 02/12/2023   K 4.2 02/12/2023   CHLORIDE 103 11/07/2015   CO2 27 02/12/2023   GLUCOSE 95 02/12/2023   BUN 20 02/12/2023   CREATININE 0.96 02/12/2023   BILITOT 0.5 02/12/2023   ALKPHOS 60 02/12/2023   AST 18 02/12/2023   ALT 18 02/12/2023   PROT 6.7 02/12/2023   ALBUMIN 4.6 02/12/2023   CALCIUM 9.5 02/12/2023   ANIONGAP 11 02/12/2023   EGFR 67 01/21/2023   Lab Results  Component Value Date   CHOL 166 01/21/2023   Lab Results  Component Value Date   HDL 75 01/21/2023    Lab Results  Component Value Date   LDLCALC 79 01/21/2023   Lab Results  Component Value Date   TRIG 63 01/21/2023   Lab Results  Component Value Date   CHOLHDL 2.2 01/21/2023   Lab Results  Component Value Date   HGBA1C 5.4 01/21/2023       Assessment & Plan:  Non-recurrent acute suppurative otitis media of left ear without spontaneous rupture of tympanic membrane Assessment & Plan: Sub acute Yellow congestion suggests bacterial component. Augmentin prescribed due to previous tolerance and effectiveness. - Prescribe Augmentin. - Advise increased fluid intake and warm teas or honey and lemon. - Recommend rest and good hand hygiene. - Instruct to discard toothbrush post-antibiotics. - Advise monitoring for breathing difficulties or need for chest x-ray if symptoms worsen.  Orders: -     Amoxicillin-Pot Clavulanate; Take 1 tablet by mouth 2 (two) times daily.  Dispense: 20 tablet; Refill: 0     Meds ordered this encounter  Medications   amoxicillin-clavulanate (AUGMENTIN) 875-125 MG tablet    Sig: Take 1 tablet by mouth 2 (two) times daily.    Dispense:  20 tablet    Refill:  0    No orders of the defined types were placed in this encounter.    Follow-up: Return if symptoms worsen or fail to improve.  An After Visit Summary was printed and given to the patient.  Total time spent on today's visit was 30 minutes, including both face-to-face time and nonface-to-face time personally spent on review of chart (labs and imaging), discussing labs and goals, discussing further work-up, treatment options, referrals to specialist if needed, reviewing outside records if pertinent, answering patient's questions, and coordinating care.   Lajuana Matte, FNP [facility] [phone]

## 2023-03-11 NOTE — Telephone Encounter (Signed)
 Copied from CRM [id number]. Topic: Clinical - Red Word Triage >> Mar 11, 2023  1:16 PM Marlow Baars wrote: Red Word that prompted transfer to Nurse Triage: The patient called in to schedule an appt as soon as possible but after speaking with her she indicated Red Word symptoms of worsening cough and discolored mucous so I will transfer her to E2C2 NT Reason for Disposition  SEVERE coughing spells (e.g., whooping sound after coughing, vomiting after coughing)  Answer Assessment - Initial Assessment Questions 1. ONSET: "When did the cough begin?"      Last Wednesday 2. SEVERITY: "How bad is the cough today?"      7-8/10 3. SPUTUM: "Describe the color of your sputum" (none, dry cough; clear, white, yellow, green)     Yellow  4. HEMOPTYSIS: "Are you coughing up any blood?" If so ask: "How much?" (flecks, streaks, tablespoons, etc.)     No 5. DIFFICULTY BREATHING: "Are you having difficulty breathing?" If Yes, ask: "How bad is it?" (e.g., mild, moderate, severe)    - MILD: No SOB at rest, mild SOB with walking, speaks normally in sentences, can lie down, no retractions, pulse < 100.    - MODERATE: SOB at rest, SOB with minimal exertion and prefers to sit, cannot lie down flat, speaks in phrases, mild retractions, audible wheezing, pulse 100-120.    - SEVERE: Very SOB at rest, speaks in single words, struggling to breathe, sitting hunched forward, retractions, pulse > 120      No 6. FEVER: "Do you have a fever?" If Yes, ask: "What is your temperature, how was it measured, and when did it start?"     No 7. CARDIAC HISTORY: "Do you have any history of heart disease?" (e.g., heart attack, congestive heart failure)      No 8. LUNG HISTORY: "Do you have any history of lung disease?"  (e.g., pulmonary embolus, asthma, emphysema)     No 9. OTHER SYMPTOMS: "Do you have any other symptoms?" (e.g., runny nose, wheezing, chest pain)       Nasal congestion w/yellow mucus, hoarse  Protocols used: Cough - Acute  Productive-A-AH

## 2023-04-08 ENCOUNTER — Other Ambulatory Visit: Payer: Self-pay | Admitting: Physician Assistant

## 2023-04-08 ENCOUNTER — Other Ambulatory Visit: Payer: Self-pay | Admitting: Family Medicine

## 2023-04-08 DIAGNOSIS — E7801 Familial hypercholesterolemia: Secondary | ICD-10-CM

## 2023-04-08 MED ORDER — ROSUVASTATIN CALCIUM 10 MG PO TABS: 10.0000 mg | ORAL_TABLET | Freq: Every day | ORAL | 1 refills | Status: DC

## 2023-05-23 LAB — HM DIABETES EYE EXAM

## 2023-05-26 ENCOUNTER — Ambulatory Visit: Payer: Self-pay | Admitting: Family Medicine

## 2023-10-04 IMAGING — MG MM DIGITAL SCREENING BILAT W/ TOMO AND CAD
8 series · 8 of 24 positions shown · non-contrast
Comparison: Previous exam(s).

CLINICAL DATA: Screening.

EXAM:
DIGITAL SCREENING BILATERAL MAMMOGRAM WITH TOMOSYNTHESIS AND CAD
TECHNIQUE: Bilateral screening digital craniocaudal and mediolateral oblique
mammograms were obtained. Bilateral screening digital breast
tomosynthesis was performed. The images were evaluated with
computer-aided detection.

[R CC synth-2D]
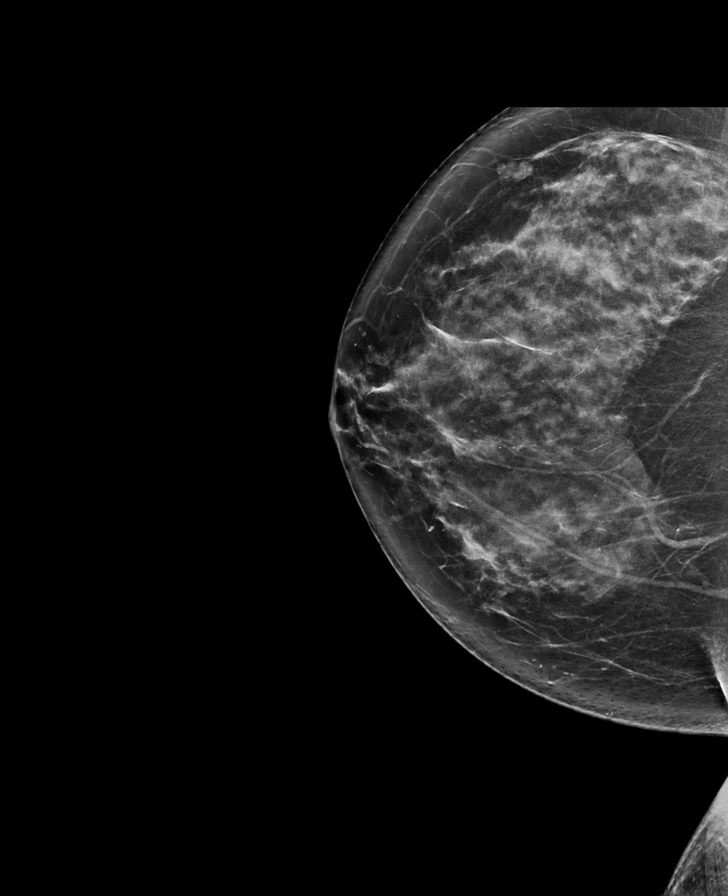

[L CC synth-2D]
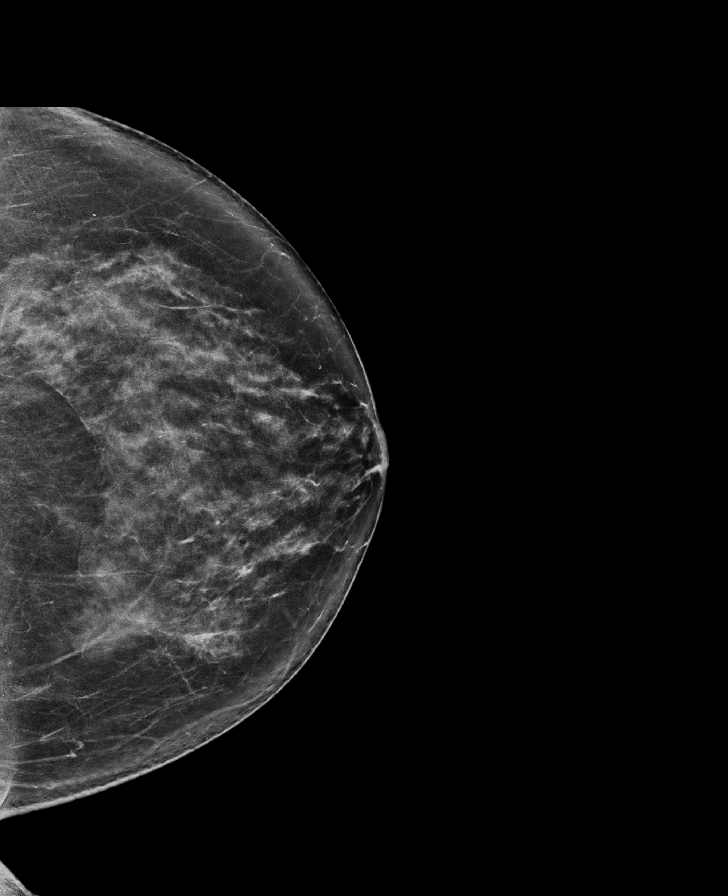

[R MLO synth-2D]
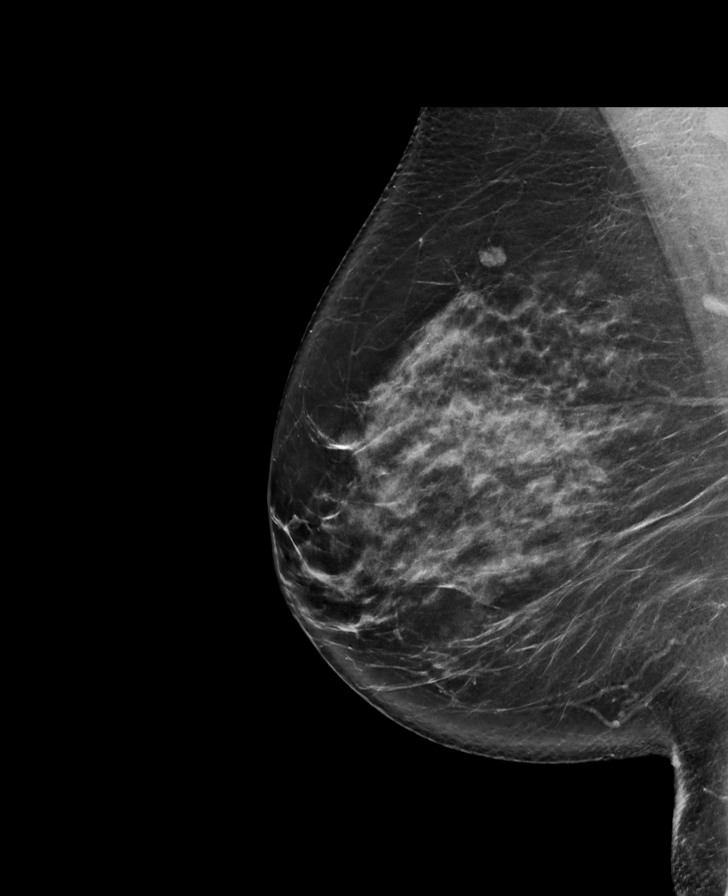

[L MLO synth-2D]
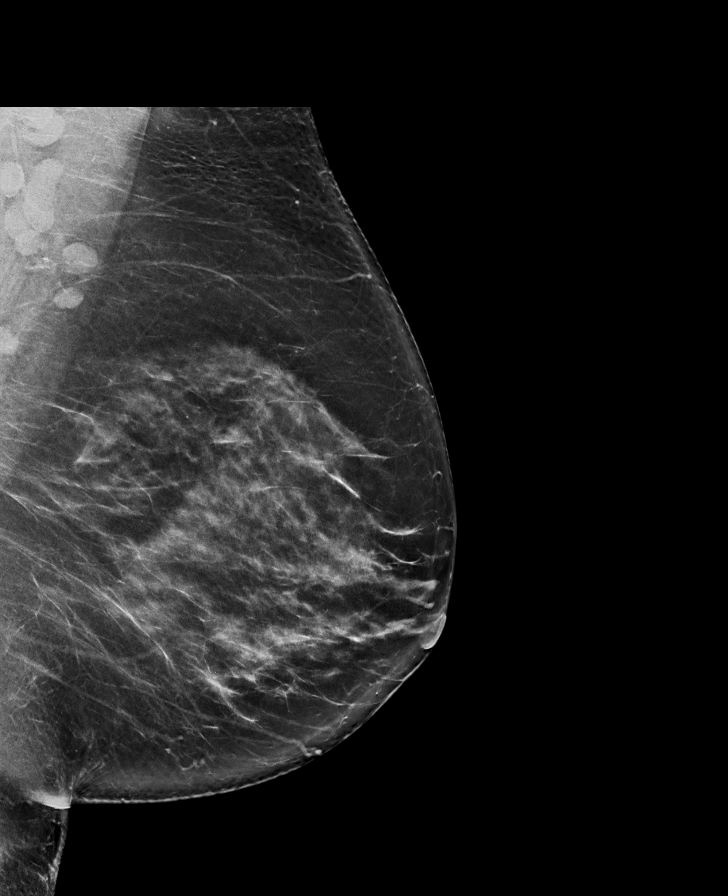

[L CC tomo · tomo slice 43/85.0]
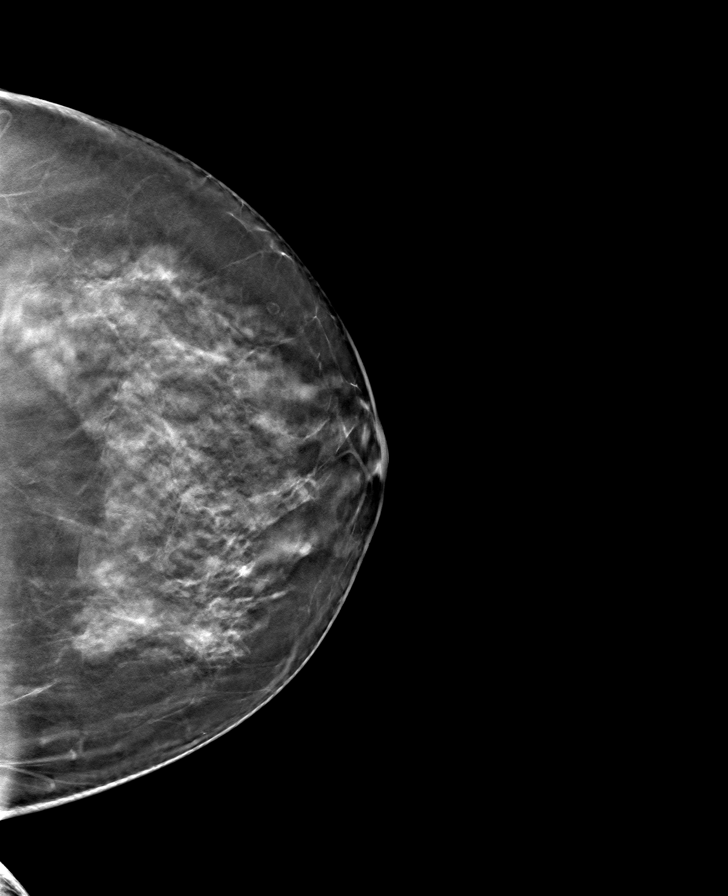

[R CC tomo · tomo slice 43/85.0]
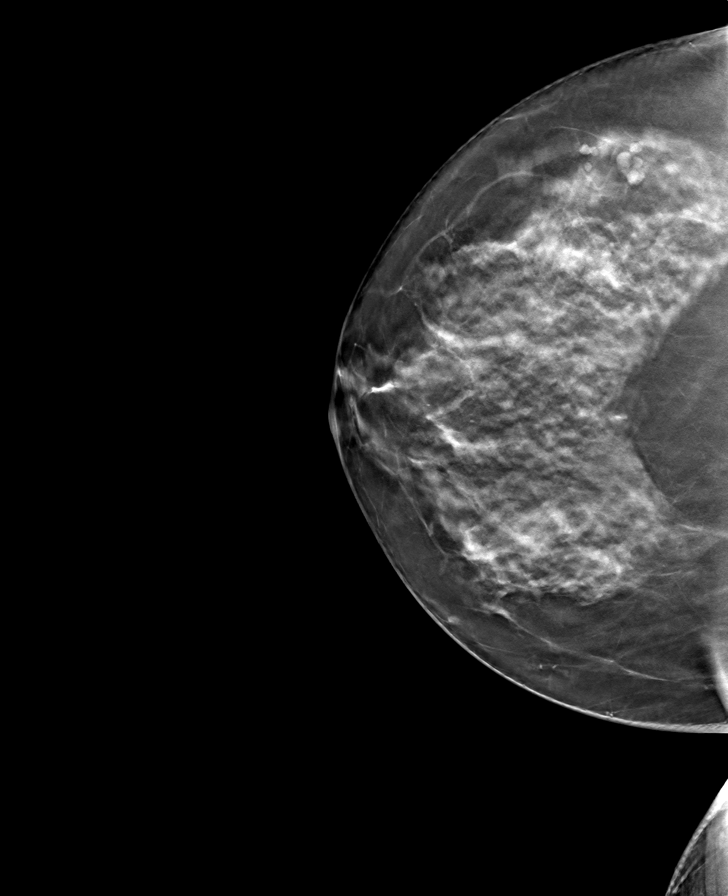

[R MLO tomo · tomo slice 46/91.0]
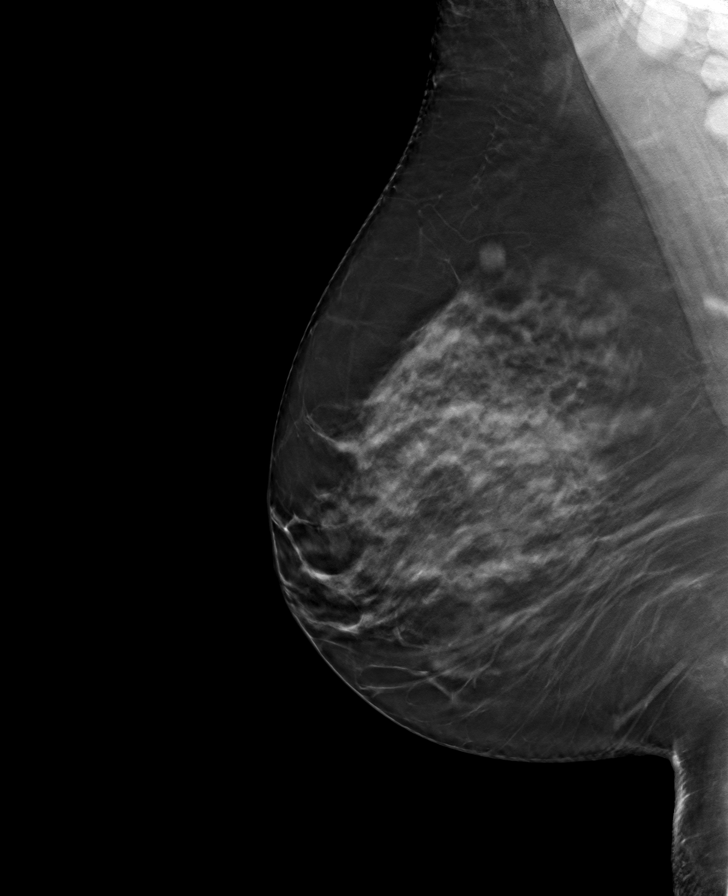

[L MLO tomo · tomo slice 47/92.0]
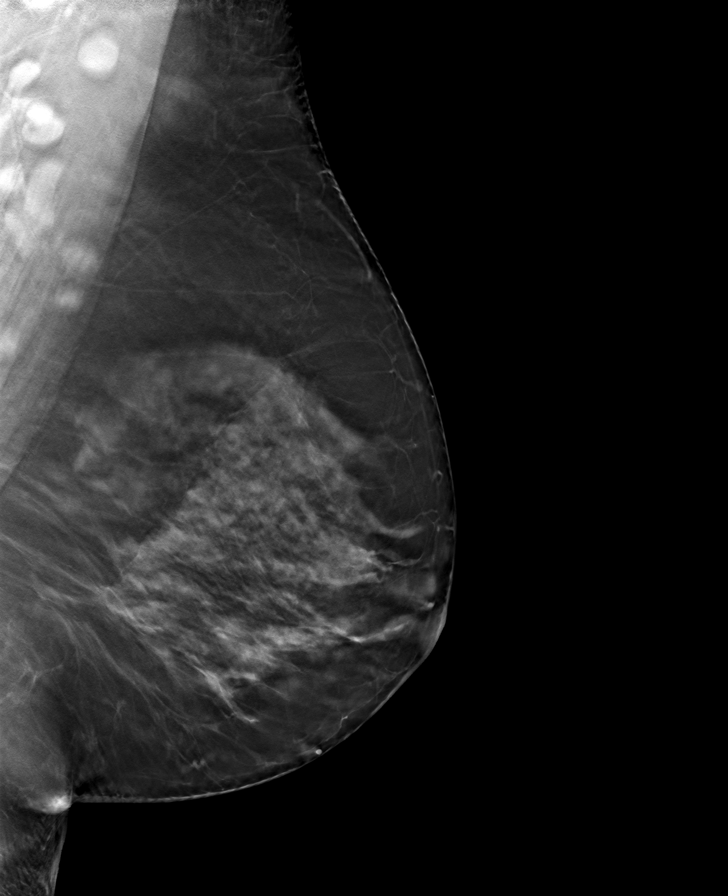

[8 of 24 positions shown; findings below may reference images not displayed]

ACR Breast Density Category c: The breast tissue is heterogeneously
dense, which may obscure small masses.
FINDINGS: There are no findings suspicious for malignancy.
IMPRESSION: No mammographic evidence of malignancy. A result letter of this
screening mammogram will be mailed directly to the patient.

RECOMMENDATION:
Screening mammogram in one year. (Code:Q3-W-BC3)

BI-RADS CATEGORY  1: Negative.

## 2023-10-24 ENCOUNTER — Other Ambulatory Visit: Payer: Self-pay | Admitting: Family Medicine

## 2023-10-24 DIAGNOSIS — Z1231 Encounter for screening mammogram for malignant neoplasm of breast: Secondary | ICD-10-CM

## 2023-10-25 ENCOUNTER — Other Ambulatory Visit: Payer: Self-pay | Admitting: Family Medicine

## 2023-10-25 DIAGNOSIS — E78019 Familial hypercholesterolemia, unspecified: Secondary | ICD-10-CM

## 2023-10-25 NOTE — Telephone Encounter (Unsigned)
 Copied from CRM #[id number]. Topic: Clinical - Medication Refill >> Oct 25, 2023 11:55 AM Antwanette L wrote: Medication: rosuvastatin  (CRESTOR ) 10 MG tablet and FLUoxetine (PROZAC) 20 MG capsule   Has the patient contacted their pharmacy? No   This is the patient's preferred pharmacy:  [facility] [address] [place] - [address] [facility] [place] [address] Phone: [phone] Fax: [phone]    Is this the correct pharmacy for this prescription? Yes   Has the prescription been filled recently? Yes. Last refill was on 04/08/23  Is the patient out of the medication? No  Has the patient been seen for an appointment in the last year OR does the patient have an upcoming appointment? Yes. Last ov w/ Dr. Sherre was on 01/21/23  Can we respond through MyChart? No. Contact the pt by phone at [phone] and [phone]  Agent: Please be advised that Rx refills may take up to 3 business days. We ask that you follow-up with your pharmacy.

## 2023-10-28 MED ORDER — ROSUVASTATIN CALCIUM 10 MG PO TABS: 10.0000 mg | ORAL_TABLET | Freq: Every day | ORAL | 3 refills | Status: AC

## 2023-10-28 MED ORDER — FLUOXETINE HCL 20 MG PO CAPS
20.0000 mg | ORAL_CAPSULE | Freq: Every day | ORAL | 3 refills | Status: AC
Start: 2023-10-28 — End: ?

## 2023-10-29 ENCOUNTER — Ambulatory Visit (INDEPENDENT_AMBULATORY_CARE_PROVIDER_SITE_OTHER)

## 2023-10-29 DIAGNOSIS — Z23 Encounter for immunization: Secondary | ICD-10-CM | POA: Diagnosis not present

## 2023-11-22 ENCOUNTER — Encounter (HOSPITAL_BASED_OUTPATIENT_CLINIC_OR_DEPARTMENT_OTHER): Payer: Self-pay

## 2023-11-22 ENCOUNTER — Other Ambulatory Visit (HOSPITAL_BASED_OUTPATIENT_CLINIC_OR_DEPARTMENT_OTHER): Payer: Self-pay

## 2023-11-22 ENCOUNTER — Ambulatory Visit (HOSPITAL_BASED_OUTPATIENT_CLINIC_OR_DEPARTMENT_OTHER)
Admission: RE | Admit: 2023-11-22 | Discharge: 2023-11-22 | Disposition: A | Discharge status: Home / Self Care | Source: Ambulatory Visit | Attending: Family Medicine | Admitting: Family Medicine

## 2023-11-22 VITALS — BP 126/85 | HR 71 | Temp 98.4°F | Resp 16

## 2023-11-22 DIAGNOSIS — H9202 Otalgia, left ear: Secondary | ICD-10-CM

## 2023-11-22 DIAGNOSIS — H9192 Unspecified hearing loss, left ear: Secondary | ICD-10-CM | POA: Diagnosis not present

## 2023-11-22 HISTORY — DX: Seborrheic dermatitis, unspecified: L21.9

## 2023-11-22 MED ORDER — FLUTICASONE PROPIONATE 50 MCG/ACT NA SUSP
1.0000 | Freq: Two times a day (BID) | NASAL | 0 refills | Status: DC | PRN
  Filled 2023-11-22: qty 16, 30d supply, fill #0

## 2023-11-22 NOTE — ED Provider Notes (Signed)
 [facility]    CSN: [id number] Arrival date & time: 11/22/23  1106      History   Chief Complaint Chief Complaint  Patient presents with   Otalgia    HPI Alicia Snow is a 68 y.o. female.   68 year old female whose had left ear pain that comes and goes since approximately July 2025.  She is also noticing that her hearing is changing or decreasing and it seems pronounced on December 14, 2023.  She cannot get any wax out of the ears that she does not know if it is clean or blocked up.  She denies fever, cough, congestion, runny nose, nausea, vomiting, constipation, diarrhea.   Otalgia Associated symptoms: hearing loss   Associated symptoms: no abdominal pain, no cough, no diarrhea, no fever, no rash, no sore throat and no vomiting     Past Medical History:  Diagnosis Date   Anxiety    Asymptomatic microscopic hematuria 01/05/2021   Back pain    Calculus of gallbladder without cholecystitis without obstruction 07/08/2022   Chronic lymphocytic leukemia (HCC) 11/22/2008   Qualifier: Diagnosis of   By: Obie MD, Princella HERO        Constipation    Depression    Diverticulitis 07/08/2022   Diverticulosis    Elevated glucose 01/21/2023   Encounter for osteoporosis screening in asymptomatic postmenopausal patient 01/28/2022   Family history of chronic ischemic heart disease 01/21/2023   Hyperlipidemia    Insulin  resistance 10/08/2019   IRRITABLE BOWEL SYNDROME 02/20/2007   Qualifier: Diagnosis of   By: Orlando CMA (AAMA), Stephanie         Mixed hyperlipidemia 01/21/2023   Nephrolithiasis    NEPHROLITHIASIS, HX OF 06/09/2007   Qualifier: Diagnosis of   By: Marcelo CMA (AAMA), Dottie         Obesity    Psoriasis 2024   Routine medical exam 01/27/2022   Seborrheic dermatitis    Severe obesity with body mass index (BMI) of 36.0 to 36.9 with serious comorbidity (HCC) 01/21/2023    Patient Active Problem List   Diagnosis Date Noted   Non-recurrent acute suppurative  otitis media of left ear without spontaneous rupture of tympanic membrane 03/11/2023   Elevated high sensitivity C-reactive protein 01/31/2023   Anxiety    Back pain    Constipation    Depression    Diverticulosis    Hyperlipidemia    Nephrolithiasis    Obesity    Elevated glucose 01/21/2023   Family history of chronic ischemic heart disease 01/21/2023   Need for hepatitis C screening test 01/21/2023   Immunization due 01/21/2023   Severe obesity with body mass index (BMI) of 36.0 to 36.9 with serious comorbidity (HCC) 01/21/2023   Mixed hyperlipidemia 01/21/2023   Diverticulitis 07/08/2022   Calculus of gallbladder without cholecystitis without obstruction 07/08/2022   Encounter for osteoporosis screening in asymptomatic postmenopausal patient 01/28/2022   Encounter for immunization 01/28/2022   Routine medical exam 01/27/2022   Psoriasis 2024   Asymptomatic microscopic hematuria 01/05/2021   Insulin  resistance 10/08/2019   Chronic lymphocytic leukemia (HCC) 11/22/2008   NEPHROLITHIASIS, HX OF 06/09/2007   IRRITABLE BOWEL SYNDROME 02/20/2007    Past Surgical History:  Procedure Laterality Date   ANAL FISSURE REPAIR     excision of polyp and external anal tag     LITHOTRIPSY      OB History     Gravida  0   Para  0   Term  0   Preterm  0   AB  0   Living  0      SAB  0   IAB  0   Ectopic  0   Multiple  0   Live Births  0            Home Medications    Prior to Admission medications   Medication Sig Start Date End Date Taking? Authorizing Provider  co-enzyme Q-10 30 MG capsule Take 200 mg by mouth daily.    Yes [provider]  FLUoxetine (PROZAC) 20 MG capsule Take 1 capsule (20 mg total) by mouth daily. 10/28/23  Yes Cox, Kirsten, MD  fluticasone [facility]) 50 MCG/ACT nasal spray Place 1 spray into both nostrils 2 (two) times daily as needed for rhinitis. 11/22/23 12/22/23 Yes Ival Domino, FNP  rosuvastatin  (CRESTOR ) 10 MG tablet  Take 1 tablet (10 mg total) by mouth daily. 10/28/23 01/26/24 Yes Cox, Kirsten, MD  VITAMIN D , ERGOCALCIFEROL , PO Take 5,000 Units by mouth daily.   Yes [provider]  Multiple Vitamins-Minerals (CENTRUM SILVER ULTRA WOMENS) TABS Take 1 tablet by mouth daily.     [provider]  nystatin cream (MYCOSTATIN) Apply 1 Application topically 2 (two) times daily. 02/07/23   [provider]  Probiotic Product (ALIGN) 4 MG CAPS Take 1 capsule by mouth daily.     [provider]    Family History Family History  Problem Relation Age of Onset   Breast cancer Mother 75       died of breast cancer   Cancer Mother    Heart disease Father    Hypertension Father    Hyperlipidemia Father    Diabetes Sister        x2   Diabetes Brother    Stroke Brother    Breast cancer Maternal Grandmother 42       died of breast cancer   Liver cancer Maternal Grandfather    Breast cancer Paternal Grandmother        died of breast ca - age unknown   Heart disease Paternal Grandmother    Heart disease Paternal Grandfather    Colon cancer Neg Hx    Esophageal cancer Neg Hx    Rectal cancer Neg Hx    Colon polyps Neg Hx    Stomach cancer Neg Hx     Social History Social History   Tobacco Use   Smoking status: Never   Smokeless tobacco: Never   Tobacco comments:    never used tobacco  Vaping Use   Vaping status: Never Used  Substance Use Topics   Alcohol use: No    Alcohol/week: 0.0 standard drinks of alcohol   Drug use: No     Allergies   Nitrofurantoin   Review of Systems Review of Systems  Constitutional:  Negative for chills and fever.  HENT:  Positive for ear pain and hearing loss. Negative for sore throat.   Eyes:  Negative for pain and visual disturbance.  Respiratory:  Negative for cough and shortness of breath.   Cardiovascular:  Negative for chest pain and palpitations.  Gastrointestinal:  Negative for abdominal pain, constipation, diarrhea,  nausea and vomiting.  Genitourinary:  Negative for dysuria and hematuria.  Musculoskeletal:  Negative for arthralgias and back pain.  Skin:  Negative for color change and rash.  Neurological:  Negative for seizures and syncope.  All other systems reviewed and are negative.    Physical Exam Triage Vital Signs ED Triage Vitals  Encounter  Vitals Group     BP 11/22/23 1201 126/85     Girls Systolic BP Percentile --      Girls Diastolic BP Percentile --      Boys Systolic BP Percentile --      Boys Diastolic BP Percentile --      Pulse Rate 11/22/23 1201 71     Resp 11/22/23 1201 16     Temp 11/22/23 1201 98.4 F (36.9 C)     Temp Source 11/22/23 1201 Oral     SpO2 11/22/23 1201 94 %     Weight --      Height --      Head Circumference --      Peak Flow --      Pain Score 11/22/23 1158 3     Pain Loc --      Pain Education --      Exclude from Growth Chart --    No data found.  Updated Vital Signs BP 126/85 (BP Location: Right Arm)   Pulse 71   Temp 98.4 F (36.9 C) (Oral)   Resp 16   LMP 01/08/2006   SpO2 94%   Visual Acuity Right Eye Distance:   Left Eye Distance:   Bilateral Distance:    Right Eye Near:   Left Eye Near:    Bilateral Near:     Physical Exam Vitals and nursing note reviewed.  Constitutional:      General: She is not in acute distress.    Appearance: She is well-developed. She is not ill-appearing or toxic-appearing.  HENT:     Head: Normocephalic and atraumatic.     Right Ear: Hearing, tympanic membrane, ear canal and external ear normal.     Left Ear: Hearing, tympanic membrane, ear canal and external ear normal.     Nose: No congestion or rhinorrhea.     Right Sinus: No maxillary sinus tenderness or frontal sinus tenderness.     Left Sinus: No maxillary sinus tenderness or frontal sinus tenderness.     Mouth/Throat:     Lips: Pink.     Mouth: Mucous membranes are moist.     Pharynx: Uvula midline. No oropharyngeal exudate or posterior  oropharyngeal erythema.     Tonsils: No tonsillar exudate.  Eyes:     Conjunctiva/sclera: Conjunctivae normal.     Pupils: Pupils are equal, round, and reactive to light.  Cardiovascular:     Rate and Rhythm: Normal rate and regular rhythm.     Heart sounds: S1 normal and S2 normal. No murmur heard. Pulmonary:     Effort: Pulmonary effort is normal. No respiratory distress.     Breath sounds: Normal breath sounds. No decreased breath sounds, wheezing, rhonchi or rales.  Abdominal:     General: Bowel sounds are normal.     Palpations: Abdomen is soft.     Tenderness: There is no abdominal tenderness.  Musculoskeletal:        General: No swelling.     Cervical back: Neck supple.  Lymphadenopathy:     Head:     Right side of head: No submental, submandibular, tonsillar, preauricular or posterior auricular adenopathy.     Left side of head: No submental, submandibular, tonsillar, preauricular or posterior auricular adenopathy.     Cervical: No cervical adenopathy.     Right cervical: No superficial cervical adenopathy.    Left cervical: No superficial cervical adenopathy.  Skin:    General: Skin is warm and dry.  Capillary Refill: Capillary refill takes less than 2 seconds.     Findings: No rash.  Neurological:     Mental Status: She is alert and oriented to person, place, and time.  Psychiatric:        Mood and Affect: Mood normal.      UC Treatments / Results  Labs (all labs ordered are listed, but only abnormal results are displayed) Labs Reviewed - No data to display  EKG   Radiology No results found.  Procedures Procedures (including critical care time)  Medications Ordered in UC Medications - No data to display  Initial Impression / Assessment and Plan / UC Course  I have reviewed the triage vital signs and the nursing notes.  Pertinent labs & imaging results that were available during my care of the patient were reviewed by me and considered in my  medical decision making (see chart for details).  Plan of Care: Left ear pain or fullness and hearing loss in the left ear: Ear exam was normal.  The patient may have some eustachian tube dysfunction.  Try fluticasone nasal spray, 1 spray into each nostril twice daily for nasal congestion.  Do not sniffle nor snort after using the fluticasone.  If symptoms persist, needs to see primary care and get a referral to ENT.  Follow-up with primary care as needed.  Return here if needed.  I reviewed the plan of care with the patient and/or the patient's guardian.  The patient and/or guardian had time to ask questions and acknowledged that the questions were answered.  I provided instruction on symptoms or reasons to return here or to go to an ER, if symptoms/condition did not improve, worsened or if new symptoms occurred.  Final Clinical Impressions(s) / UC Diagnoses   Final diagnoses:  Left ear pain  Hearing loss of left ear, unspecified hearing loss type     Discharge Instructions      Left ear pain or fullness and hearing loss in the left ear: Ear exam was normal.  The patient may have some eustachian tube dysfunction.  Try fluticasone nasal spray, 1 spray into each nostril twice daily for nasal congestion.  Do not sniffle nor snort after using the fluticasone.  If symptoms persist, needs to see primary care and get a referral to ENT.  Follow-up with primary care as needed.  Return here if needed.     ED Prescriptions     Medication Sig Dispense Auth. Provider   fluticasone (FLONASE) 50 MCG/ACT nasal spray Place 1 spray into both nostrils 2 (two) times daily as needed for rhinitis. 16 g Ival Domino, FNP      PDMP not reviewed this encounter.   Ival Domino, FNP 11/22/23 [phone]

## 2023-11-22 NOTE — ED Triage Notes (Signed)
 Pt c/o left ear pain that comes and goes for 3 months. Pt states she noticed yesterday she had decrease hearing out of the ear.

## 2023-11-22 NOTE — Discharge Instructions (Addendum)
 Left ear pain or fullness and hearing loss in the left ear: Ear exam was normal.  The patient may have some eustachian tube dysfunction.  Try fluticasone nasal spray, 1 spray into each nostril twice daily for nasal congestion.  Do not sniffle nor snort after using the fluticasone.  If symptoms persist, needs to see primary care and get a referral to ENT.  Follow-up with primary care as needed.  Return here if needed.

## 2023-12-10 ENCOUNTER — Ambulatory Visit
Admission: RE | Admit: 2023-12-10 | Discharge: 2023-12-10 | Disposition: A | Source: Ambulatory Visit | Attending: Family Medicine | Admitting: Family Medicine

## 2023-12-10 DIAGNOSIS — Z1231 Encounter for screening mammogram for malignant neoplasm of breast: Secondary | ICD-10-CM

## 2023-12-17 ENCOUNTER — Ambulatory Visit: Payer: Self-pay | Admitting: Family Medicine

## 2024-01-23 ENCOUNTER — Encounter: Payer: Self-pay | Admitting: Family Medicine

## 2024-01-23 ENCOUNTER — Ambulatory Visit: Payer: Commercial Managed Care - PPO | Admitting: Family Medicine

## 2024-01-23 VITALS — BP 124/64 | HR 75 | Temp 97.9°F | Ht 62.0 in | Wt 199.5 lb

## 2024-01-23 DIAGNOSIS — H918X2 Other specified hearing loss, left ear: Secondary | ICD-10-CM

## 2024-01-23 DIAGNOSIS — R7309 Other abnormal glucose: Secondary | ICD-10-CM | POA: Diagnosis not present

## 2024-01-23 DIAGNOSIS — Z6836 Body mass index (BMI) 36.0-36.9, adult: Secondary | ICD-10-CM | POA: Diagnosis not present

## 2024-01-23 DIAGNOSIS — C9110 Chronic lymphocytic leukemia of B-cell type not having achieved remission: Secondary | ICD-10-CM | POA: Diagnosis not present

## 2024-01-23 DIAGNOSIS — L409 Psoriasis, unspecified: Secondary | ICD-10-CM | POA: Diagnosis not present

## 2024-01-23 DIAGNOSIS — E782 Mixed hyperlipidemia: Secondary | ICD-10-CM

## 2024-01-23 DIAGNOSIS — E559 Vitamin D deficiency, unspecified: Secondary | ICD-10-CM | POA: Diagnosis not present

## 2024-01-23 DIAGNOSIS — N3001 Acute cystitis with hematuria: Secondary | ICD-10-CM

## 2024-01-23 DIAGNOSIS — N029 Recurrent and persistent hematuria with unspecified morphologic changes: Secondary | ICD-10-CM

## 2024-01-23 DIAGNOSIS — H9192 Unspecified hearing loss, left ear: Secondary | ICD-10-CM | POA: Insufficient documentation

## 2024-01-23 LAB — POCT URINALYSIS DIP (CLINITEK)
Bilirubin, UA: NEGATIVE
Glucose, UA: NEGATIVE mg/dL
Ketones, POC UA: NEGATIVE mg/dL
Nitrite, UA: NEGATIVE
Spec Grav, UA: 1.02
Urobilinogen, UA: 0.2 U/dL
pH, UA: 6

## 2024-01-23 LAB — POCT LIPID PANEL
HDL: 50
LDL: 50
Non-HDL: 65
TC: 115
TRG: 78

## 2024-01-23 NOTE — Assessment & Plan Note (Addendum)
 At goal Orders:   VITAMIN D  25 Hydroxy (Vit-D Deficiency, Fractures)

## 2024-01-23 NOTE — Assessment & Plan Note (Addendum)
 Management per specialist. Check blood count Orders:   CBC with Differential/Platelet

## 2024-01-23 NOTE — Assessment & Plan Note (Addendum)
 Recommend continue to work on eating healthy diet and exercise. Check A1c.  Orders:   Hemoglobin A1c

## 2024-01-23 NOTE — Assessment & Plan Note (Addendum)
 Limited hearing in the left ear with sensation of fullness. No significant cerumen impaction or systemic symptoms. - Will refer to ENT if hearing test is abnormal. Orders:   Ambulatory referral to ENT

## 2024-01-23 NOTE — Assessment & Plan Note (Addendum)
 stable

## 2024-01-23 NOTE — Assessment & Plan Note (Addendum)
 Comorbidities: hyperlipidemia Recommend continue to work on eating healthy diet and exercise.

## 2024-01-23 NOTE — Assessment & Plan Note (Addendum)
 At goal No changes to medicines. Continue crestor  10 mg before bed.  Continue to work on eating a healthy diet and exercise.  Labs drawn today.   Orders:   POCT Lipid Panel   Comprehensive metabolic panel with GFR

## 2024-01-23 NOTE — Progress Notes (Signed)
 "  Subjective:  Patient ID: Alicia Snow, female    DOB: 1955-03-16  Age: 69 y.o. MRN: [id number]  Chief Complaint  Patient presents with   Annual Exam    HPI: Discussed the use of AI scribe software for clinical note transcription with the patient, who gave verbal consent to proceed.  History of Present Illness Alicia Snow is a 69 year old female who presents with limited hearing in her left ear.  Auditory symptoms - Limited hearing in the left ear for approximately one month - Sensation described as being 'in a tunnel' - No improvement with use of Q-tip - No associated symptoms: fever, chills, sweats, chest pain, shortness of breath, abdominal pain, bowel changes, nausea, vomiting, bladder issues, joint or muscle pain, dizziness, or headaches  Hematologic history - Chronic lymphocytic leukemia - Awaiting blood work results; last blood work was one year ago - No issues with blood clotting or energy levels  Hematuria - History of hematuria with prior cystoscopy - Diagnosed with benign hematuria - Interested in checking urine again to assess for persistence of hematuria  Cardiovascular risk management - Recent cardiology visit - Cholesterol medication increased from CRESTOR  5 mg to 10 mg due to family history, despite normal test results - Tolerating cholesterol medication well without side effects  Mood and psychosocial concerns - Currently taking Prozac  - Mood is stable - Experiencing family-related stressors - Concerned about husband's memory due to family history of dementia among men in her family       01/23/2024    8:15 AM 01/21/2023    8:05 AM 07/04/2022    1:37 PM 01/18/2022    2:24 PM 01/05/2021    8:22 AM  Depression screen PHQ 2/9  Decreased Interest 0 0 0 0 0  Down, Depressed, Hopeless 0 0 0 0 0  PHQ - 2 Score 0 0 0 0 0  Altered sleeping 0 0   0  Tired, decreased energy 0 0   0  Change in appetite 0 0   0  Feeling bad or failure about yourself  0 0   0   Trouble concentrating 0 0   0  Moving slowly or fidgety/restless 0 0   0  Suicidal thoughts 0 0   0  PHQ-9 Score 0 0    0   Difficult doing work/chores Not difficult at all Not difficult at all   Not difficult at all     Data saved with a previous flowsheet row definition        01/21/2023    8:05 AM  Fall Risk   Falls in the past year? 0  Number falls in past yr: 0  Injury with Fall? 0   Risk for fall due to : No Fall Risks  Follow up Falls evaluation completed     Data saved with a previous flowsheet row definition    Patient Care Team: Sherre Clapper, MD as PCP - General (Family Medicine)   Review of Systems  Constitutional:  Negative for chills, fatigue and fever.  HENT:  Positive for hearing loss (left ear). Negative for congestion, ear pain and sore throat.   Respiratory:  Negative for cough and shortness of breath.   Cardiovascular:  Negative for chest pain.  Gastrointestinal:  Negative for abdominal pain, constipation, diarrhea, nausea and vomiting.  Genitourinary:  Negative for dysuria and urgency.  Musculoskeletal:  Negative for arthralgias and myalgias.  Skin:  Negative for rash.  Neurological:  Negative for  dizziness and headaches.  Psychiatric/Behavioral:  Negative for dysphoric mood. The patient is not nervous/anxious.     Medications Ordered Prior to Encounter[1] Past Medical History:  Diagnosis Date   Anxiety    Asymptomatic microscopic hematuria 01/05/2021   Back pain    Calculus of gallbladder without cholecystitis without obstruction 07/08/2022   Chronic lymphocytic leukemia (HCC) 11/22/2008   Qualifier: Diagnosis of   By: Obie MD, Princella HERO        Constipation    Depression    Diverticulitis 07/08/2022   Diverticulosis    Elevated glucose 01/21/2023   Encounter for osteoporosis screening in asymptomatic postmenopausal patient 01/28/2022   Family history of chronic ischemic heart disease 01/21/2023   Hyperlipidemia    Insulin  resistance  10/08/2019   IRRITABLE BOWEL SYNDROME 02/20/2007   Qualifier: Diagnosis of   By: Orlando CMA (AAMA), Corean         Mixed hyperlipidemia 01/21/2023   Nephrolithiasis    NEPHROLITHIASIS, HX OF 06/09/2007   Qualifier: Diagnosis of   By: Marcelo CMA (AAMA), Dottie         Obesity    Psoriasis 2024   Routine medical exam 01/27/2022   Seborrheic dermatitis    Severe obesity with body mass index (BMI) of 36.0 to 36.9 with serious comorbidity (HCC) 01/21/2023   Past Surgical History:  Procedure Laterality Date   ANAL FISSURE REPAIR     excision of polyp and external anal tag     LITHOTRIPSY      Family History  Problem Relation Age of Onset   Breast cancer Mother 29       died of breast cancer   Cancer Mother    Heart disease Father    Hypertension Father    Hyperlipidemia Father    Diabetes Sister        x2   Diabetes Brother    Stroke Brother    Breast cancer Maternal Grandmother 42       died of breast cancer   Liver cancer Maternal Grandfather    Breast cancer Paternal Grandmother        died of breast ca - age unknown   Heart disease Paternal Grandmother    Heart disease Paternal Grandfather    Colon cancer Neg Hx    Esophageal cancer Neg Hx    Rectal cancer Neg Hx    Colon polyps Neg Hx    Stomach cancer Neg Hx    Social History   Socioeconomic History   Marital status: Married    Spouse name: Not on file   Number of children: 0   Years of education: Not on file   Highest education level: Not on file  Occupational History   Occupation: para legal  Tobacco Use   Smoking status: Never   Smokeless tobacco: Never   Tobacco comments:    never used tobacco  Vaping Use   Vaping status: Never Used  Substance and Sexual Activity   Alcohol use: No    Alcohol/week: 0.0 standard drinks of alcohol   Drug use: No   Sexual activity: Not on file  Other Topics Concern   Not on file  Social History Narrative   Not on file   Social Drivers of Health    Tobacco Use: Low Risk (01/23/2024)   Received from [facility]   Patient History    Smoking Tobacco Use: Never    Smokeless Tobacco Use: Never    Passive Exposure: Not on file  Financial  Resource Strain: Low Risk (01/23/2024)   Overall Financial Resource Strain (CARDIA)    Difficulty of Paying Living Expenses: Not hard at all  Food Insecurity: Low Risk (01/22/2024)   Received from [facility]   Epic    Within the past 12 months, you worried that your food would run out before you got money to buy more: Never true    Within the past 12 months, the food you bought just didn't last and you didn't have money to get more. : Never true  Transportation Needs: No Transportation Needs (01/22/2024)   Received from [facility]    In the past 12 months, has lack of reliable transportation kept you from medical appointments, meetings, work or from getting things needed for daily living? : No  Physical Activity: Sufficiently Active (01/23/2024)   Exercise Vital Sign    Days of Exercise per Week: 3 days    Minutes of Exercise per Session: 60 min  Stress: No Stress Concern Present (01/23/2024)   [employer] of Occupational Health - Occupational Stress Questionnaire    Feeling of Stress: Not at all  Social Connections: Moderately Isolated (01/23/2024)   Social Connection and Isolation Panel    Frequency of Communication with Friends and Family: Three times a week    Frequency of Social Gatherings with Friends and Family: Three times a week    Attends Religious Services: Never    Active Member of Clubs or Organizations: No    Attends Banker Meetings: Never    Marital Status: Married  Depression (PHQ2-9): Low Risk (01/23/2024)   Depression (PHQ2-9)    PHQ-2 Score: 0  Alcohol Screen: Low Risk (01/23/2024)   Alcohol Screen    Last Alcohol Screening Score (AUDIT): 0  Housing: Low Risk (01/22/2024)   Received from [facility]   Epic    What is your living  situation today?: I have a steady place to live    Think about the place you live. Do you have problems with any of the following? Choose all that apply:: None/None on this list  Utilities: Low Risk (01/22/2024)   Received from [facility]   Utilities    In the past 12 months has the electric, gas, oil, or water company threatened to shut off services in your home? : No  Health Literacy: Adequate Health Literacy (01/23/2024)   B1300 Health Literacy    Frequency of need for help with medical instructions: Never    Objective:  BP 124/64   Pulse 75   Temp 97.9 F (36.6 C)   Ht 5' 2 (1.575 m)   Wt 199 lb 8 oz (90.5 kg)   LMP 01/08/2006   SpO2 95%   BMI 36.49 kg/m      01/23/2024    8:12 AM 11/22/2023   12:01 PM 03/11/2023    2:27 PM  BP/Weight  Systolic BP 124 126 106  Diastolic BP 64 85 64  Wt. (Lbs) 199.5  196.6  BMI 36.49 kg/m2  35.96 kg/m2    Physical Exam Vitals reviewed.  Constitutional:      Appearance: Normal appearance. She is obese.  HENT:     Right Ear: Tympanic membrane normal. There is no impacted cerumen.     Left Ear: Tympanic membrane normal. There is no impacted cerumen.  Neck:     Vascular: No carotid bruit.  Cardiovascular:     Rate and Rhythm: Normal rate and regular rhythm.     Heart sounds:  Normal heart sounds.  Pulmonary:     Effort: Pulmonary effort is normal.     Breath sounds: Normal breath sounds.  Abdominal:     General: Bowel sounds are normal.     Palpations: Abdomen is soft.     Tenderness: There is no abdominal tenderness.  Neurological:     Mental Status: She is alert and oriented to person, place, and time.  Psychiatric:        Mood and Affect: Mood normal.        Behavior: Behavior normal.         Lab Results  Component Value Date   WBC 14.5 (H) 01/23/2024   HGB 13.6 01/23/2024   HCT 42.2 01/23/2024   PLT 226 01/23/2024   GLUCOSE 96 01/23/2024   CHOL 166 01/21/2023   TRIG 63 01/21/2023   HDL 75 01/21/2023    LDLCALC 79 01/21/2023   ALT 24 01/23/2024   AST 20 01/23/2024   NA 142 01/23/2024   K 4.9 01/23/2024   CL 102 01/23/2024   CREATININE 1.03 (H) 01/23/2024   BUN 18 01/23/2024   CO2 24 01/23/2024   TSH 2.280 01/21/2023   HGBA1C 5.5 01/23/2024    Results for orders placed or performed in visit on 01/23/24  POCT Lipid Panel   Collection Time: 01/23/24  8:53 AM  Result Value Ref Range   TC 115    HDL 50    TRG 78    LDL 50    Non-HDL 65    TC/HDL    CBC with Differential/Platelet   Collection Time: 01/23/24  8:55 AM  Result Value Ref Range   WBC 14.5 (H) 3.4 - 10.8 x10E3/uL   RBC 4.59 3.77 - 5.28 x10E6/uL   Hemoglobin 13.6 11.1 - 15.9 g/dL   Hematocrit 57.7 65.9 - 46.6 %   MCV 92 79 - 97 fL   MCH 29.6 26.6 - 33.0 pg   MCHC 32.2 31.5 - 35.7 g/dL   RDW 86.9 88.2 - 84.5 %   Platelets 226 150 - 450 x10E3/uL   Neutrophils 34 Not Estab. %   Lymphs 59 Not Estab. %   Monocytes 5 Not Estab. %   Eos 1 Not Estab. %   Basos 1 Not Estab. %   Neutrophils Absolute 4.9 1.4 - 7.0 x10E3/uL   Lymphocytes Absolute 8.7 (H) 0.7 - 3.1 x10E3/uL   Monocytes Absolute 0.7 0.1 - 0.9 x10E3/uL   EOS (ABSOLUTE) 0.1 0.0 - 0.4 x10E3/uL   Basophils Absolute 0.1 0.0 - 0.2 x10E3/uL   Immature Granulocytes 0 Not Estab. %   Immature Grans (Abs) 0.0 0.0 - 0.1 x10E3/uL   Hematology Comments: Note:   Comprehensive metabolic panel with GFR   Collection Time: 01/23/24  8:55 AM  Result Value Ref Range   Glucose 96 70 - 99 mg/dL   BUN 18 8 - 27 mg/dL   Creatinine, Ser 8.96 (H) 0.57 - 1.00 mg/dL   eGFR 60 >40 fO/fpw/8.26   BUN/Creatinine Ratio 17 12 - 28   Sodium 142 134 - 144 mmol/L   Potassium 4.9 3.5 - 5.2 mmol/L   Chloride 102 96 - 106 mmol/L   CO2 24 20 - 29 mmol/L   Calcium  9.9 8.7 - 10.3 mg/dL   Total Protein 6.7 6.0 - 8.5 g/dL   Albumin 4.8 3.9 - 4.9 g/dL   Globulin, Total 1.9 1.5 - 4.5 g/dL   Bilirubin Total 0.5 0.0 - 1.2 mg/dL   Alkaline Phosphatase 90  49 - 135 IU/L   AST 20 0 - 40 IU/L    ALT 24 0 - 32 IU/L  VITAMIN D  25 Hydroxy (Vit-D Deficiency, Fractures)   Collection Time: 01/23/24  8:55 AM  Result Value Ref Range   Vit D, 25-Hydroxy 66.0 30.0 - 100.0 ng/mL  Hemoglobin A1c   Collection Time: 01/23/24  8:55 AM  Result Value Ref Range   Hgb A1c MFr Bld 5.5 4.8 - 5.6 %   Est. average glucose Bld gHb Est-mCnc 111 mg/dL  POCT URINALYSIS DIP (CLINITEK)   Collection Time: 01/23/24  9:29 AM  Result Value Ref Range   Color, UA yellow yellow   Clarity, UA clear clear   Glucose, UA negative negative mg/dL   Bilirubin, UA negative negative   Ketones, POC UA negative negative mg/dL   Spec Grav, UA 8.979 8.989 - 1.025   Blood, UA moderate (A) negative   pH, UA 6.0 5.0 - 8.0   POC PROTEIN,UA trace negative, trace   Urobilinogen, UA 0.2 0.2 or 1.0 E.U./dL   Nitrite, UA Negative Negative   Leukocytes, UA Trace (A) Negative  Urine Culture   Collection Time: 01/23/24  9:40 AM   Specimen: Urine   UR  Result Value Ref Range   Urine Culture, Routine Final report    Organism ID, Bacteria Comment   .  Assessment & Plan:   Assessment & Plan Chronic lymphocytic leukemia (HCC) Management per specialist. Check blood count Orders:   CBC with Differential/Platelet   Elevated glucose Recommend continue to work on eating healthy diet and exercise. Check A1c Orders:   Hemoglobin A1c   Mixed hyperlipidemia At goal No changes to medicines. Continue crestor  10 mg before bed.  Continue to work on eating a healthy diet and exercise.  Labs drawn today.   Orders:   POCT Lipid Panel   Comprehensive metabolic panel with GFR   Psoriasis stable    Vitamin D  insufficiency At goal Orders:   VITAMIN D  25 Hydroxy (Vit-D Deficiency, Fractures)  Severe obesity with body mass index (BMI) of 36.0 to 36.9 with serious comorbidity (HCC) Comorbidities: hyperlipidemia Recommend continue to work on eating healthy diet and exercise.    Other specified hearing loss of left ear,  unspecified hearing status on contralateral side Limited hearing in the left ear with sensation of fullness. No significant cerumen impaction or systemic symptoms. - Will refer to ENT if hearing test is abnormal. Orders:   Ambulatory referral to ENT  Benign hematuria Urine culture was negative.  Orders:   POCT URINALYSIS DIP (CLINITEK)   Urine Culture    Body mass index is 36.49 kg/m.  Assessment and Plan Assessment & Plan Hearing loss, left ear Limited hearing in the left ear with sensation of fullness. No significant cerumen impaction or systemic symptoms. - Will refer to ENT if hearing test is abnormal.  Chronic lymphocytic leukemia Awaiting blood work results. - Ordered blood work to assess CLL status.  Mixed hyperlipidemia Managed with increased cholesterol medication dosage. Current LDL is 50, meeting target levels. No adverse effects reported. - Continue current cholesterol medication dosage.  Morbid obesity Reports weight loss. - Encouraged continued dietary modifications and physical activity.  Hematuria Previously evaluated and diagnosed as benign hematuria. No current symptoms. - Checked urine for hematuria.  Recording duration: 8 minutes     No orders of the defined types were placed in this encounter.   Orders Placed This Encounter  Procedures   Urine Culture   CBC  with Differential/Platelet   Comprehensive metabolic panel with GFR   VITAMIN D  25 Hydroxy (Vit-D Deficiency, Fractures)   Hemoglobin A1c   Ambulatory referral to ENT   POCT Lipid Panel   POCT URINALYSIS DIP (CLINITEK)       Follow-up: Return in about 6 months (around 07/22/2024) for chronic follow up.  An After Visit Summary was printed and given to the patient.  Abigail Free, MD [facility] [phone]     [1]  Current Outpatient Medications on File Prior to Visit  Medication Sig Dispense Refill   co-enzyme Q-10 30 MG capsule Take 200 mg by mouth daily.       FLUoxetine  (PROZAC ) 20 MG capsule Take 1 capsule (20 mg total) by mouth daily. 90 capsule 3   Multiple Vitamins-Minerals (CENTRUM SILVER ULTRA WOMENS) TABS Take 1 tablet by mouth daily.      nystatin cream (MYCOSTATIN) Apply 1 Application topically 2 (two) times daily.     Probiotic Product (ALIGN) 4 MG CAPS Take 1 capsule by mouth daily.      rosuvastatin  (CRESTOR ) 10 MG tablet Take 1 tablet (10 mg total) by mouth daily. 90 tablet 3   VITAMIN D , ERGOCALCIFEROL , PO Take 5,000 Units by mouth daily.     No current facility-administered medications on file prior to visit.   "

## 2024-01-24 LAB — CBC WITH DIFFERENTIAL/PLATELET
Basophils Absolute: 0.1 x10E3/uL (ref 0.0–0.2)
Basos: 1 %
EOS (ABSOLUTE): 0.1 x10E3/uL (ref 0.0–0.4)
Eos: 1 %
Hematocrit: 42.2 % (ref 34.0–46.6)
Hemoglobin: 13.6 g/dL (ref 11.1–15.9)
Immature Grans (Abs): 0 x10E3/uL (ref 0.0–0.1)
Immature Granulocytes: 0 %
Lymphocytes Absolute: 8.7 x10E3/uL — ABNORMAL HIGH (ref 0.7–3.1)
Lymphs: 59 %
MCH: 29.6 pg (ref 26.6–33.0)
MCHC: 32.2 g/dL (ref 31.5–35.7)
MCV: 92 fL (ref 79–97)
Monocytes Absolute: 0.7 x10E3/uL (ref 0.1–0.9)
Monocytes: 5 %
Neutrophils Absolute: 4.9 x10E3/uL (ref 1.4–7.0)
Neutrophils: 34 %
Platelets: 226 x10E3/uL (ref 150–450)
RBC: 4.59 x10E6/uL (ref 3.77–5.28)
RDW: 13 % (ref 11.7–15.4)
WBC: 14.5 x10E3/uL — ABNORMAL HIGH (ref 3.4–10.8)

## 2024-01-24 LAB — COMPREHENSIVE METABOLIC PANEL WITH GFR
ALT: 24 IU/L (ref 0–32)
AST: 20 IU/L (ref 0–40)
Albumin: 4.8 g/dL (ref 3.9–4.9)
Alkaline Phosphatase: 90 IU/L (ref 49–135)
BUN/Creatinine Ratio: 17 (ref 12–28)
BUN: 18 mg/dL (ref 8–27)
Bilirubin Total: 0.5 mg/dL (ref 0.0–1.2)
CO2: 24 mmol/L (ref 20–29)
Calcium: 9.9 mg/dL (ref 8.7–10.3)
Chloride: 102 mmol/L (ref 96–106)
Creatinine, Ser: 1.03 mg/dL — ABNORMAL HIGH (ref 0.57–1.00)
Globulin, Total: 1.9 g/dL (ref 1.5–4.5)
Glucose: 96 mg/dL (ref 70–99)
Potassium: 4.9 mmol/L (ref 3.5–5.2)
Sodium: 142 mmol/L (ref 134–144)
Total Protein: 6.7 g/dL (ref 6.0–8.5)
eGFR: 60 mL/min/1.73

## 2024-01-24 LAB — HEMOGLOBIN A1C
Est. average glucose Bld gHb Est-mCnc: 111 mg/dL
Hgb A1c MFr Bld: 5.5 % (ref 4.8–5.6)

## 2024-01-24 LAB — URINE CULTURE

## 2024-01-24 LAB — VITAMIN D 25 HYDROXY (VIT D DEFICIENCY, FRACTURES): Vit D, 25-Hydroxy: 66 ng/mL (ref 30.0–100.0)

## 2024-01-25 ENCOUNTER — Ambulatory Visit: Payer: Self-pay | Admitting: Family Medicine

## 2024-01-27 DIAGNOSIS — N029 Recurrent and persistent hematuria with unspecified morphologic changes: Secondary | ICD-10-CM | POA: Insufficient documentation

## 2024-01-27 DIAGNOSIS — H9190 Unspecified hearing loss, unspecified ear: Secondary | ICD-10-CM | POA: Insufficient documentation

## 2024-01-27 NOTE — Assessment & Plan Note (Signed)
 Urine culture was negative.  Orders:   POCT URINALYSIS DIP (CLINITEK)   Urine Culture

## 2024-02-11 ENCOUNTER — Inpatient Hospital Stay: Payer: Commercial Managed Care - PPO

## 2024-02-11 ENCOUNTER — Inpatient Hospital Stay: Payer: Commercial Managed Care - PPO | Admitting: Hematology & Oncology

## 2024-02-14 ENCOUNTER — Other Ambulatory Visit: Payer: Self-pay

## 2024-02-14 DIAGNOSIS — C9110 Chronic lymphocytic leukemia of B-cell type not having achieved remission: Secondary | ICD-10-CM

## 2024-02-17 ENCOUNTER — Inpatient Hospital Stay

## 2024-02-17 ENCOUNTER — Inpatient Hospital Stay: Admitting: Hematology & Oncology

## 2024-02-27 ENCOUNTER — Institutional Professional Consult (permissible substitution) (INDEPENDENT_AMBULATORY_CARE_PROVIDER_SITE_OTHER): Admitting: Otolaryngology

## 2024-07-22 ENCOUNTER — Ambulatory Visit: Admitting: Family Medicine

## 2025-01-26 ENCOUNTER — Encounter: Admitting: Family Medicine
